# Patient Record
Sex: Male | Born: 1935 | Race: White | Hispanic: No | Marital: Married | Smoking: Never smoker
Health system: Southern US, Community
[De-identification: ages and names within clinical notes are randomized; demographics above are authoritative.]

## PROBLEM LIST (undated history)

## (undated) DIAGNOSIS — A809 Acute poliomyelitis, unspecified: Secondary | ICD-10-CM

## (undated) DIAGNOSIS — I739 Peripheral vascular disease, unspecified: Secondary | ICD-10-CM

## (undated) DIAGNOSIS — I10 Essential (primary) hypertension: Secondary | ICD-10-CM

## (undated) DIAGNOSIS — Z529 Donor of unspecified organ or tissue: Secondary | ICD-10-CM

## (undated) DIAGNOSIS — Z524 Kidney donor: Secondary | ICD-10-CM

## (undated) DIAGNOSIS — Z87448 Personal history of other diseases of urinary system: Secondary | ICD-10-CM

## (undated) DIAGNOSIS — I214 Non-ST elevation (NSTEMI) myocardial infarction: Secondary | ICD-10-CM

## (undated) DIAGNOSIS — I2510 Atherosclerotic heart disease of native coronary artery without angina pectoris: Secondary | ICD-10-CM

## (undated) HISTORY — DX: Non-ST elevation (NSTEMI) myocardial infarction: I21.4

## (undated) HISTORY — DX: Kidney donor: Z52.4

## (undated) HISTORY — DX: Donor of unspecified organ or tissue: Z52.9

## (undated) HISTORY — DX: Atherosclerotic heart disease of native coronary artery without angina pectoris: I25.10

## (undated) HISTORY — DX: Personal history of other diseases of urinary system: Z87.448

## (undated) HISTORY — DX: Acute poliomyelitis, unspecified: A80.9

## (undated) HISTORY — PX: KIDNEY SURGERY: SHX687

## (undated) HISTORY — DX: Peripheral vascular disease, unspecified: I73.9

## (undated) HISTORY — DX: Essential (primary) hypertension: I10

---

## 1980-04-08 HISTORY — PX: PANCREAS SURGERY: SHX731

## 1987-04-09 HISTORY — PX: CHOLECYSTECTOMY: SHX55

## 1999-11-07 HISTORY — PX: CORONARY ANGIOPLASTY WITH STENT PLACEMENT: SHX49

## 2001-11-06 HISTORY — PX: OTHER SURGICAL HISTORY: SHX169

## 2004-04-08 HISTORY — PX: PTCA: SHX146

## 2006-04-08 HISTORY — PX: CORONARY ANGIOPLASTY: SHX604

## 2006-06-01 ENCOUNTER — Inpatient Hospital Stay: Payer: Self-pay | Admitting: Internal Medicine

## 2006-06-01 ENCOUNTER — Other Ambulatory Visit: Payer: Self-pay

## 2006-06-03 ENCOUNTER — Encounter: Payer: Self-pay | Admitting: Cardiovascular Disease

## 2006-06-03 ENCOUNTER — Inpatient Hospital Stay (HOSPITAL_COMMUNITY): Admission: AD | Admit: 2006-06-03 | Discharge: 2006-06-05 | Payer: Self-pay | Admitting: Cardiovascular Disease

## 2006-07-04 ENCOUNTER — Emergency Department: Payer: Self-pay | Admitting: Unknown Physician Specialty

## 2008-04-20 ENCOUNTER — Ambulatory Visit: Payer: Self-pay | Admitting: Urology

## 2009-06-14 ENCOUNTER — Ambulatory Visit: Payer: Self-pay | Admitting: Vascular Surgery

## 2009-10-31 ENCOUNTER — Encounter: Payer: Self-pay | Admitting: Cardiovascular Disease

## 2009-11-08 ENCOUNTER — Ambulatory Visit: Payer: Self-pay | Admitting: Vascular Surgery

## 2009-11-29 ENCOUNTER — Ambulatory Visit: Payer: Self-pay | Admitting: General Practice

## 2009-12-05 ENCOUNTER — Encounter: Payer: Self-pay | Admitting: Cardiovascular Disease

## 2009-12-05 ENCOUNTER — Ambulatory Visit: Payer: Self-pay | Admitting: Cardiology

## 2009-12-05 ENCOUNTER — Inpatient Hospital Stay: Payer: Self-pay | Admitting: Internal Medicine

## 2009-12-06 ENCOUNTER — Encounter: Payer: Self-pay | Admitting: Cardiovascular Disease

## 2009-12-06 ENCOUNTER — Encounter: Payer: Self-pay | Admitting: Cardiology

## 2009-12-12 ENCOUNTER — Telehealth: Payer: Self-pay | Admitting: Cardiology

## 2009-12-20 ENCOUNTER — Ambulatory Visit: Payer: Self-pay | Admitting: Cardiovascular Disease

## 2009-12-20 DIAGNOSIS — I2510 Atherosclerotic heart disease of native coronary artery without angina pectoris: Secondary | ICD-10-CM | POA: Insufficient documentation

## 2009-12-20 DIAGNOSIS — I471 Supraventricular tachycardia: Secondary | ICD-10-CM

## 2009-12-20 DIAGNOSIS — E785 Hyperlipidemia, unspecified: Secondary | ICD-10-CM

## 2009-12-26 ENCOUNTER — Encounter: Payer: Self-pay | Admitting: Cardiovascular Disease

## 2010-01-03 ENCOUNTER — Ambulatory Visit: Payer: Self-pay | Admitting: Cardiovascular Disease

## 2010-01-03 ENCOUNTER — Inpatient Hospital Stay (HOSPITAL_COMMUNITY): Admission: EM | Admit: 2010-01-03 | Discharge: 2010-01-04 | Payer: Self-pay | Admitting: Emergency Medicine

## 2010-01-03 ENCOUNTER — Encounter: Payer: Self-pay | Admitting: Internal Medicine

## 2010-01-08 ENCOUNTER — Ambulatory Visit: Payer: Self-pay | Admitting: Internal Medicine

## 2010-01-08 ENCOUNTER — Ambulatory Visit (HOSPITAL_COMMUNITY): Admission: RE | Admit: 2010-01-08 | Discharge: 2010-01-09 | Payer: Self-pay | Admitting: Internal Medicine

## 2010-01-18 ENCOUNTER — Encounter: Payer: Self-pay | Admitting: Cardiovascular Disease

## 2010-01-23 ENCOUNTER — Encounter: Payer: Self-pay | Admitting: Cardiovascular Disease

## 2010-01-30 ENCOUNTER — Encounter: Payer: Self-pay | Admitting: Cardiovascular Disease

## 2010-03-09 ENCOUNTER — Ambulatory Visit: Payer: Self-pay | Admitting: Internal Medicine

## 2010-03-21 ENCOUNTER — Ambulatory Visit: Payer: Self-pay | Admitting: Cardiovascular Disease

## 2010-05-08 NOTE — Consult Note (Signed)
[facility]   Imported By: Harlon Flor 12/15/2009 16:41:41  _____________________________________________________________________  External Attachment:    Type:   Image     Comment:   External Document

## 2010-05-08 NOTE — Assessment & Plan Note (Signed)
Summary: NP6/AMD   Visit Type:  Initial Consult Primary Provider:  Dr. Lacie Scotts  CC:  F/U [facility]. "Doing well.".  History of Present Illness: 77 year old gentleman with history of coronary artery disease, PCI of the LAD and circumflex in 2008, PAD with left SFA stenting in August 2000 and who presented to the hospital on August 30 with lightheadedness, weakness, tight chest. He presents to establish care today.  He was noted to be in a junctional tachycardia with a rate of 119 beats per minute, right bundle branch block. This converted to normal sinus rhythm. He had a cardiac catheterization that showed no significant stenoses.  Cardiac catheterization showed a 70-80% OM1 disease at the distal edge of the stent. FFR was performed with a measurement of 0.9 to suggesting an insignificant lesion. It was felt to be 50-60%. No stent was placed at this time. There was 40% stenosis of the mid RCA, 30-40% ostial left main, 25% in-stent restenosis of the proximal LAD, 30% mid LAD disease, patent stent in the mid left circumflex into the OM1, 25% in-stent restenosis. 90% ostial disease of a small ramus.  He was discharged on metoprolol 50 mg daily. His wife had cut the dose in half to 25 mg IV metoprolol succinate as he was having bradycardia with rates in the 40s. On the 25 mg, he has felt well with no complaints. No further episodes of lightheadedness or dizziness.  EKG today shows normal sinus rhythm with rate 61 beats per minute, right bundle branch block, nonspecific ST changes in leads 3, aVF  Preventive Screening-Counseling & Management  Alcohol-Tobacco     Smoking Status: never  Caffeine-Diet-Exercise     Does Patient Exercise: yes  Current Medications (verified): 1)  Plavix 75 Mg Tabs (Clopidogrel Bisulfate) .... 1/2  Tablet Once Daily 2)  Glucotrol 10 Mg Tabs (Glipizide) .... 1/2 Tablet A.m. 1/2 Noon and One At Bedtime 3)  Metoprolol Succinate 50 Mg Xr24h-Tab (Metoprolol Succinate) ....  1/2 Tablet At Bedtime 4)  Simvastatin 40 Mg Tabs (Simvastatin) .... One Tablet At Bedtime 5)  Nitrostat 0.4 Mg Subl (Nitroglycerin) .... As Needed  Allergies (verified): 1)  ! * Ivp Dye  Past History:  Family History: Last updated: 12/20/2009 Family History of Coronary Artery Disease:  Family History of CVA or Stroke: Father deceased  Social History: Last updated: 12/20/2009 Married  Tobacco Use - No.  Alcohol Use - no Regular Exercise - yes-very active with yard work.  Risk Factors: Exercise: yes (12/20/2009)  Risk Factors: Smoking Status: never (12/20/2009)  Past Medical History: CAD, s/p PTCA x 2 (LAD and circumflex, last in 2008). percutaneous coronary intervention to the LAD in 2006 @ [place] PAD s/p left SFA, poplitearl, and anterior tibial as well as a stent in the left SFA on 8/03. PTCA /stent  in 11/1999 Non-ST-elevation MI Hx. of polio diabetes mellitus Hypertension Hx. of chronic kidney disease Hx. of pancreatic donation Hx. of kidney donation  Past Surgical History: CAD, s/p PTCA x 2 (LAD and circumflex, last in 2008). percutaneous coronary intervention to the LAD in 2006 @ [place] PAD s/p left SFA, poplitearl, and anterior tibial as well as a stent in the left SFA on 8/03. PTCA /stent  in 11/1999 gallbladder removed; 1989 40% pancrease removed;1982 Right kidney removed  Family History: Family History of Coronary Artery Disease:  Family History of CVA or Stroke: Father deceased  Social History: Married  Tobacco Use - No.  Alcohol Use - no Regular Exercise - yes-very active with  yard work. Smoking Status:  never Does Patient Exercise:  yes  Review of Systems  The patient denies fever, weight loss, weight gain, vision loss, decreased hearing, hoarseness, chest pain, syncope, dyspnea on exertion, peripheral edema, prolonged cough, abdominal pain, incontinence, muscle weakness, depression, and enlarged lymph nodes.    Vital Signs:  Patient  profile:   77 year old male Height:      69 inches Weight:      156 pounds BMI:     23.12 Pulse rate:   61 / minute BP sitting:   116 / 65  (left arm) Cuff size:   regular  Vitals Entered By: Bishop Dublin, CMA (December 20, 2009 11:41 AM)  Physical Exam  General:  Well developed, well nourished, in no acute distress.elderly gentleman Head:  normocephalic and atraumatic Neck:  Neck supple, no JVD. No masses, thyromegaly or abnormal cervical nodes. Lungs:  Clear bilaterally to auscultation and percussion. Heart:  Non-displaced PMI, chest non-tender; regular rate and rhythm, S1, S2 without murmurs, rubs or gallops. Carotid upstroke normal, 1+ bruit on the left. Pedals normal pulses. No edema, no varicosities. Abdomen:  abdomen soft and non-tender without masses Msk:  Back normal, normal gait. Muscle strength and tone normal. Pulses:  pulses normal in all 4 extremities Extremities:  No clubbing or cyanosis. Neurologic:  Alert and oriented x 3. Skin:  Intact without lesions or rashes. Psych:  Normal affect.   Impression & Recommendations:  Problem # 1:  CAD, NATIVE VESSEL (ICD-414.01) history of coronary artery disease with recent cardiac catheterization. Patent stents in his LAD and left circumflex. OM disease with no recent intervention. We'll continue aggressive lipid management. He is taking a half dose of Plavix as he has significant bruising. We have suggested that he add aspirin 81 mg daily, possibly to tablets if he does not have significant bruising.  The following medications were removed from the medication list:    Aspirin 81 Mg Tbec (Aspirin) ..... One tablet once daily His updated medication list for this problem includes:    Plavix 75 Mg Tabs (Clopidogrel bisulfate) .Marland Kitchen... 1/2  tablet once daily    Metoprolol Succinate 50 Mg Xr24h-tab (Metoprolol succinate) .Marland Kitchen... 1/2 tablet at bedtime    Nitrostat 0.4 Mg Subl (Nitroglycerin) .Marland Kitchen... As needed    Aspirin 81 Mg Tbec  (Aspirin) .Marland Kitchen... Take one tablet by mouth daily  Problem # 2:  SVT/ PSVT/ PAT (ICD-427.0) Notes indicate a accelerated junctional rhythm with rates in the 120s. He's not had any more of these rhythms on his metoprolol succinate 25 mg daily. We'll continue him on this dose.  The following medications were removed from the medication list:    Aspirin 81 Mg Tbec (Aspirin) ..... One tablet once daily His updated medication list for this problem includes:    Plavix 75 Mg Tabs (Clopidogrel bisulfate) .Marland Kitchen... 1/2  tablet once daily    Metoprolol Succinate 50 Mg Xr24h-tab (Metoprolol succinate) .Marland Kitchen... 1/2 tablet at bedtime    Nitrostat 0.4 Mg Subl (Nitroglycerin) .Marland Kitchen... As needed    Aspirin 81 Mg Tbec (Aspirin) .Marland Kitchen... Take one tablet by mouth daily  Problem # 3:  HYPERLIPIDEMIA-MIXED (ICD-272.4) He is currently on simvastatin 40 mg daily. We have suggested that he have his cholesterol checked in November with goal LDL less than 70. Cholesterol in June showed total cholesterol 200, LDL of 120  The following medications were removed from the medication list:    Crestor 5 Mg Tabs (Rosuvastatin calcium) ..... One tablet once  daily His updated medication list for this problem includes:    Simvastatin 40 Mg Tabs (Simvastatin) ..... One tablet at bedtime  Patient Instructions: 1)  Your physician has recommended you make the following change in your medication: START aspirin 81mg    2)  Your physician wants you to follow-up in:  3 months  You will receive a reminder letter in the mail two months in advance. If you don't receive a letter, please call our office to schedule the follow-up appointment.

## 2010-05-08 NOTE — Cardiovascular Report (Signed)
[facility]   Imported By: Harlon Flor 12/15/2009 16:41:01  _____________________________________________________________________  External Attachment:    Type:   Image     Comment:   External Document

## 2010-05-08 NOTE — Letter (Signed)
Summary: [facility]  [facility]I   Imported By: Harlon Flor 12/25/2009 08:47:22  _____________________________________________________________________  External Attachment:    Type:   Image     Comment:   External Document

## 2010-05-08 NOTE — Assessment & Plan Note (Signed)
Summary: eph/post ablation/amber   Visit Type:  Follow-up Primary [name]:  Dr. Lacie Scotts  CC:  s/p ablation.  "I am doing great"!.  History of Present Illness: [name] returns for followup. He is a pleasant 77 yo man with a h/o SVT and CAD who underwent EPS/RFA of AVNRT several months ago.  He has had no recurrent palpitations and feels well.  No c/p or sob.  Current Medications (verified): 1)  Glucotrol 10 Mg Tabs (Glipizide) .... 1/2 Noon and 1/2 At Bedtime 2)  Nitrostat 0.4 Mg Subl (Nitroglycerin) .... As Needed 3)  Aspirin 81 Mg Tbec (Aspirin) .... Take Two  Tablet By Mouth Daily  Allergies (verified): 1)  ! * Ivp Dye  Past History:  Past Medical History: Last updated: 12/20/2009 CAD, s/p PTCA x 2 (LAD and circumflex, last in 2008). percutaneous coronary intervention to the LAD in 2006 @ [place] PAD s/p left SFA, poplitearl, and anterior tibial as well as a stent in the left SFA on 8/03. PTCA /stent  in 11/1999 Non-ST-elevation MI Hx. of polio diabetes mellitus Hypertension Hx. of chronic kidney disease Hx. of pancreatic donation Hx. of kidney donation  Past Surgical History: Last updated: 12/20/2009 CAD, s/p PTCA x 2 (LAD and circumflex, last in 2008). percutaneous coronary intervention to the LAD in 2006 @ [place] PAD s/p left SFA, poplitearl, and anterior tibial as well as a stent in the left SFA on 8/03. PTCA /stent  in 11/1999 gallbladder removed; 1989 40% pancrease removed;1982 Right kidney removed  Risk Factors: Exercise: yes (12/20/2009)  Risk Factors: Smoking Status: never (12/20/2009)  Review of Systems  The patient denies chest pain, syncope, dyspnea on exertion, and peripheral edema.    Vital Signs:  Patient profile:   77 year old male Height:      69 inches Weight:      150 pounds BMI:     22.23 Pulse rate:   64 / minute BP sitting:   132 / 60  (left arm) Cuff size:   regular  Vitals Entered By: [name], CMA (March 09, 2010  10:17 AM)  Physical Exam  General:  Well developed, well nourished, in no acute distress.elderly gentleman Head:  normocephalic and atraumatic Neck:  Neck supple, no JVD. No masses, thyromegaly or abnormal cervical nodes. Lungs:  Clear bilaterally to auscultation with no wheezes, rales, or rhonchi. Heart:  Non-displaced PMI, chest non-tender; regular rate and rhythm, S1, S2 without murmurs, rubs or gallops. Carotid upstroke normal, 1+ bruit on the left. Pedals normal pulses. No edema, no varicosities. Abdomen:  abdomen soft and non-tender without masses Msk:  Back normal, normal gait. Muscle strength and tone normal. Pulses:  pulses normal in all 4 extremities Extremities:  No clubbing or cyanosis. Neurologic:  Alert and oriented x 3.   EKG  Procedure date:  03/09/2010  Findings:      Normal sinus rhythm with rate of:  Right bundle branch block.    Comments:      Evidence of remote septal MI.    Impression & Recommendations:  Problem # 1:  SVT/ PSVT/ PAT (ICD-427.0) Assessment Improved HIs symptom.s have resolved since his ablation. Will follow. The following medications were removed from the medication list:    Plavix 75 Mg Tabs (Clopidogrel bisulfate) .Marland Kitchen... 1/2  tablet once daily    Metoprolol Succinate 50 Mg Xr24h-tab (Metoprolol succinate) .Marland Kitchen... 1/2 tablet at bedtime His updated medication list for this problem includes:    Nitrostat 0.4 Mg Subl (Nitroglycerin) .Marland KitchenMarland KitchenMarland KitchenMarland Kitchen  As needed    Aspirin 81 Mg Tbec (Aspirin) .Marland Kitchen... Take two  tablet by mouth daily  Problem # 2:  CAD, NATIVE VESSEL (ICD-414.01) He remains very active and has not had any anginal symptoms.  Will continue meds as below and he will followup with Dr. Mariah Milling. The following medications were removed from the medication list:    Plavix 75 Mg Tabs (Clopidogrel bisulfate) .Marland Kitchen... 1/2  tablet once daily    Metoprolol Succinate 50 Mg Xr24h-tab (Metoprolol succinate) .Marland Kitchen... 1/2 tablet at bedtime His updated medication list  for this problem includes:    Nitrostat 0.4 Mg Subl (Nitroglycerin) .Marland Kitchen... As needed    Aspirin 81 Mg Tbec (Aspirin) .Marland Kitchen... Take two  tablet by mouth daily  Patient Instructions: 1)  Your physician recommends that you schedule a follow-up appointment in: 6 months with Dr. Mariah Milling and follow up with Dr. Ladona Ridgel as needed 2)  Your physician recommends that you continue on your current medications as directed. Please refer to the Current Medication list given to you today.

## 2010-05-08 NOTE — Progress Notes (Signed)
Summary: DIZZINESS  Phone Note Call from Patient Call back at Home Phone [phone]   Caller: [name]) Call For: [facility] Summary of Call: PT HAD CATH LAST WEEK AND HE IS FEELING DIZZY-HAS BEEN TAKING METOPROLOL-HAS NOT TAKEN THE MEDICATION THIS MORNING BECAUSE OF THE DIZZINESS-BP IS 135/68 THIS MORNING-BP 111/52 NOW Initial call taken by: Harlon Flor,  December 12, 2009 10:25 AM  Follow-up for Phone Call        124/62-65 pt feeling much better, only took 1/2 metoprolol this am. Please advise if it is okay to continue taking 1/2 metoprolol two times a day? Follow-up by: Benedict Needy, RN,  December 12, 2009 2:55 PM     Appended Document: DIZZINESS ok to take 12.5 mg bid  Appended Document: DIZZINESS LMOM TCB   Appended Document: DIZZINESS no answer after 10+ rings.   Appended Document: DIZZINESS LMOM TCB  Appended Document: DIZZINESS pt is aware

## 2010-05-08 NOTE — Cardiovascular Report (Signed)
Summary: Cardiac Cath Other  Cardiac Cath Other   Imported By: Harlon Flor 12/25/2009 08:39:40  _____________________________________________________________________  External Attachment:    Type:   Image     Comment:   External Document

## 2010-05-08 NOTE — Letter (Signed)
Summary: [facility] No Show Fax   [facility] No Show Fax   Imported By: Roderic Ovens 02/13/2010 12:45:53  _____________________________________________________________________  External Attachment:    Type:   Image     Comment:   External Document

## 2010-05-08 NOTE — Miscellaneous (Signed)
Summary: [facility]   [facility]   Imported By: Roderic Ovens 02/07/2010 12:27:48  _____________________________________________________________________  External Attachment:    Type:   Image     Comment:   External Document

## 2010-05-08 NOTE — Procedures (Signed)
Summary: Summary Report  Summary Report   Imported By: Erle Crocker 02/23/2010 16:01:49  _____________________________________________________________________  External Attachment:    Type:   Image     Comment:   External Document

## 2010-05-08 NOTE — Cardiovascular Report (Signed)
Summary: Cardiac Cath Other  Cardiac Cath Other   Imported By: Harlon Flor 12/25/2009 08:39:55  _____________________________________________________________________  External Attachment:    Type:   Image     Comment:   External Document

## 2010-05-10 NOTE — Procedures (Signed)
Summary: [facility]  [facility]   Imported By: Harlon Flor 04/12/2010 09:54:42  _____________________________________________________________________  External Attachment:    Type:   Image     Comment:   External Document

## 2010-06-21 LAB — GLUCOSE, CAPILLARY
Glucose-Capillary: 112 mg/dL — ABNORMAL HIGH (ref 70–99)
Glucose-Capillary: 143 mg/dL — ABNORMAL HIGH (ref 70–99)
Glucose-Capillary: 148 mg/dL — ABNORMAL HIGH (ref 70–99)
Glucose-Capillary: 155 mg/dL — ABNORMAL HIGH (ref 70–99)
Glucose-Capillary: 180 mg/dL — ABNORMAL HIGH (ref 70–99)
Glucose-Capillary: 72 mg/dL (ref 70–99)

## 2010-06-21 LAB — BASIC METABOLIC PANEL
BUN: 27 mg/dL — ABNORMAL HIGH (ref 6–23)
CO2: 28 mEq/L (ref 19–32)
Chloride: 105 mEq/L (ref 96–112)
Creatinine, Ser: 1.44 mg/dL (ref 0.4–1.5)
Glucose, Bld: 118 mg/dL — ABNORMAL HIGH (ref 70–99)
Potassium: 4.5 mEq/L (ref 3.5–5.1)

## 2010-06-21 LAB — CBC
MCH: 30.9 pg (ref 26.0–34.0)
MCHC: 33.8 g/dL (ref 30.0–36.0)
MCHC: 34 g/dL (ref 30.0–36.0)
Platelets: 131 10*3/uL — ABNORMAL LOW (ref 150–400)
Platelets: 132 10*3/uL — ABNORMAL LOW (ref 150–400)
RBC: 3.72 MIL/uL — ABNORMAL LOW (ref 4.22–5.81)
RDW: 13 % (ref 11.5–15.5)
RDW: 13 % (ref 11.5–15.5)

## 2010-06-21 LAB — COMPREHENSIVE METABOLIC PANEL
ALT: 20 U/L (ref 0–53)
AST: 27 U/L (ref 0–37)
Calcium: 9.6 mg/dL (ref 8.4–10.5)
Creatinine, Ser: 1.61 mg/dL — ABNORMAL HIGH (ref 0.4–1.5)
GFR calc Af Amer: 50 mL/min — ABNORMAL LOW (ref >60)
Sodium: 139 mEq/L (ref 135–145)
Total Protein: 7 g/dL (ref 6.0–8.3)

## 2010-06-21 LAB — APTT: aPTT: 30 seconds (ref 24–37)

## 2010-06-21 LAB — CK TOTAL AND CKMB (NOT AT ARMC): Relative Index: INVALID (ref 0.0–2.5)

## 2010-06-21 LAB — PROTIME-INR
Prothrombin Time: 13.2 seconds (ref 11.6–15.2)
Prothrombin Time: 14.3 seconds (ref 11.6–15.2)

## 2010-06-21 LAB — DIFFERENTIAL
Eosinophils Absolute: 0.3 10*3/uL (ref 0.0–0.7)
Eosinophils Relative: 5 % (ref 0–5)
Lymphocytes Relative: 18 % (ref 12–46)
Lymphs Abs: 1 10*3/uL (ref 0.7–4.0)
Monocytes Relative: 7 % (ref 3–12)
Neutrophils Relative %: 70 % (ref 43–77)

## 2010-06-21 LAB — CARDIAC PANEL(CRET KIN+CKTOT+MB+TROPI)
CK, MB: 5.2 ng/mL — ABNORMAL HIGH (ref 0.3–4.0)
CK, MB: 5.6 ng/mL — ABNORMAL HIGH (ref 0.3–4.0)
Relative Index: INVALID (ref 0.0–2.5)
Troponin I: 0.5 ng/mL (ref 0.00–0.06)
Troponin I: 0.71 ng/mL (ref 0.00–0.06)

## 2010-06-21 LAB — TROPONIN I: Troponin I: 0.11 ng/mL — ABNORMAL HIGH (ref 0.00–0.06)

## 2010-08-24 NOTE — Discharge Summary (Signed)
[name], RYNER               ACCOUNT NO.:  [id number]   MEDICAL RECORD NO.:  [id number]          PATIENT TYPE:  INP   LOCATION:  6524                         FACILITY:  [facility]   PHYSICIAN:  Abelino Derrick, P.A.   DATE OF BIRTH:  [birth year]   DATE OF ADMISSION:  06/03/2006  DATE OF DISCHARGE:  06/05/2006                               DISCHARGE SUMMARY   DISCHARGE DIAGNOSES:  1. Unstable angina with abnormal Cardiolite study, circumflex stenting      this admission.  2. Known coronary disease with prior left anterior descending stenting      in [place] in 2006.  3. Treated dyslipidemia.  4. Non-insulin-dependent diabetes.  5. History of polio.  6. Chronic renal insufficiency, creatinine is 1.6 at discharge.  7. Treated hypertension.   HOSPITAL COURSE:  The patient is a 77 year old male who presented at  [facility] 06/01/2006 with acute chest pain,  diaphoresis and nausea.  He has been actually having symptoms for a  couple of weeks but they were worse on the day of admission.  He does  have a history of coronary disease and then had an left anterior  descending stent in [place] in 2006.  Ech[facility]ogram as [facility] shows ejection fraction 50 to 55%.  Cardiolite stress test was  also performed which revealed anterolateral ischemia.  He was  transferred to [facility] for further evaluation and catheterization.  His  creatinine was 1.4.  The patient had a catheterization done 06/03/2006  by Dr. Allyson Sabal.  This revealed his EF to be 55%.  Catheterization revealed  a tapered 40% proximal left main, patent LAD stent, 40% mid LAD, 80%  circumflex at the takeoff of large second OM branch and a 50 to 60% mid  RCA.  The patient underwent circumflex intervention with a Taxus stent.  Post procedure he did well.  We did use Mucomyst and IV fluids for renal  preservation.  We held his ARB.  Postprocedure his creatinine went up to  1.6.  He did have some residual  chest pain and there was some concern  that this may be coming from the RCA as there was also some evidence of  inferior ischemia on Cardiolite.  Nitrate was added as well as PPI.  He  was kept an extra 24 hours.  He is pain free on the 28th and Dr. Tresa Endo  feels he can be discharged.  Will hold his ACE inhibitors and ARB for  now.  He will follow up with Dr. Tresa Endo in a couple weeks in [place].   DISCHARGE MEDICATIONS:  Coated aspirin 325 mg once a day, Plavix 75 mg a  day, glipizide 5 mg a day, simvastatin 40 mg a day, Imdur 30 mg a day,  metoprolol 25 mg twice a day, omeprazole 20 mg a day and nitroglycerin  sublingual p.r.n.   LABS:  Troponin went to 0.15.  CK and MBs were negative.  At discharge  his sodium was 137, potassium 4.6, BUN 35, creatinine 1.6.  White count  11.3, hemoglobin 13.2, hematocrit 38.7, platelets 127.  Her EKG  shows  sinus rhythm, right bundle branch block, sinus bradycardia.   DISPOSITION:  The patient discharged in stable condition.  He will need  a follow-up CBC and platelet count and BMP in 1 week in [place].  He  will see Dr. Tresa Endo also in follow up.      Abelino Derrick, P.Memory Dance  D:  06/05/2006  T:  06/05/2006  Job:  [id number]

## 2010-08-24 NOTE — Cardiovascular Report (Signed)
[name], [name]               ACCOUNT NO.:  [id number]   MEDICAL RECORD NO.:  [id number]          PATIENT TYPE:  INP   LOCATION:  6524                         FACILITY:  [facility]   PHYSICIAN:  Nanetta Batty, M.D.   DATE OF BIRTH:  [birth year]   DATE OF PROCEDURE:  06/03/2006  DATE OF DISCHARGE:                            CARDIAC CATHETERIZATION   PROCEDURE:  Cardiac catheterization/PCI stent.   INDICATIONS:  Mr. [name] is a 77 year old gentleman who underwent  stenting approximately 2 years ago to his LAD.  His problems included  history of hyperlipidemia, diabetes, and renal insufficiency from  donation of kidney to his daughter.  He presented to [facility] over this past weekend with unstable angina.  His enzymes were  negative.  He underwent Myoview stress testing, which was remarkable for  anterolateral ischemia.  He did develop ST segment depression during  exercise with chest pain.  He was transferred to [facility] for cardiac  catheterization and potential intervention because of the high risk  nature of his stress test.   PROCEDURE IN DETAIL:  The patient was brought to the second floor [facility] cardiac catheterization lab in a postabsorptive state.  He was  premedicated with p.o. valium, IV Benadryl, Solu-Medrol, and Pepcid for  presumed contrast allergy prophylaxis.  His right groin was prepped and  draped in the usual sterile fashion.  A 1% Xylocaine was used for local  anesthesia.  A 6 French sheath was inserted into the right femoral  artery using standard Seldinger technique.  A 6 French right Judkins  catheter was started as well as a 6 French pigtail catheter, was used  for selective coronary angiography and left ventricular artery  respectively.  This pigtail was used through the entirety of the case.  Retrograde aortogram, ventricular blood pressures were recorded.   HEMODYNAMICS:  Aortic systolic pressure 136, diastolic pressure 56, left  ventricular systolic pressure 136 and diastolic pressure 11.   Selective coronary angiography.  1. Left main:  Left main left main had an approximately 40% osteal      taper stenosis without __________.  2. LAD:  There was a stent in the proximal LAD, which was widely      patent.  There was a very small focal area of 30% narrowing, which      was hypodense and just after the stent appears a 40% segmental      stenosis in the mid to distal portion.  3. Left circumflex; this is a nondominant vessel to the large marginal      branch and then 80% stenosis in the mid portion.  4. Right coronary artery; a large dominant vessel with 50% to 60%      stenosis in the mid portion.  5. Left ventriculography; RAO left ventriculogram was performed using      25 mL of Visipaque dye at 12 mL per second.  The overall LV EF was      estimated approximately 55% without focal wall motion      abnormalities.   IMPRESSION:  Mr. [name] has high grade mid  circumflex disease.  Because  of the minimal amount of dye used for the diagnostic case, I feel  compelled to proceed with intervention at this time.   The patient was on aspirin and Plavix already, received an initial 300  mg of Plavix.  He received Angiomax bolus with an ACT of greater than  250.  Using an __________ 3-0 guide catheter along with an 0.014 190  Asahi soft wire, a 259 Maverick, a PTC was performed.  200 mcg of  coronary nitroglycerin was administered.  This revealed a larger distal  vessel than originally visualized.  Following this, a 25 12 Taxus drug-  eluting stent was then deployed at 14 atmosphere (2.8 mm), resulting in  reduction of 80% to 0% residual without dissection.  There was TIMI 3  flow at the end of the case in all vessels.  The patient tolerated the  procedure well without hemodynamic electrocardiograph sequelae.   IMPRESSION:  Mr. [name] is status post left circumflex PCI stenting  using Angiomax and drug-eluting stent  (Taxus).  We will proceed  hydration and check his renal function in the morning.  He will continue  with aspirin and Plavix.  He will be discharged home when clinically  appropriate, likely in the next 24-48 hours.  He left the operating room  in stable condition.      Nanetta Batty, M.D.  Electronically Signed     JB/MEDQ  D:  06/03/2006  T:  06/04/2006  Job:  [id number]   cc:   [facility]  Second Floor [facility] Cardiac Cath lab  [facility]

## 2010-10-17 ENCOUNTER — Encounter: Payer: Self-pay | Admitting: Internal Medicine

## 2010-10-29 ENCOUNTER — Inpatient Hospital Stay: Payer: Self-pay | Admitting: Internal Medicine

## 2010-10-29 DIAGNOSIS — R079 Chest pain, unspecified: Secondary | ICD-10-CM

## 2010-10-30 DIAGNOSIS — I214 Non-ST elevation (NSTEMI) myocardial infarction: Secondary | ICD-10-CM

## 2010-11-02 ENCOUNTER — Other Ambulatory Visit: Payer: Self-pay | Admitting: Cardiovascular Disease

## 2010-11-02 ENCOUNTER — Other Ambulatory Visit: Payer: Self-pay

## 2010-11-02 MED ORDER — CLOPIDOGREL BISULFATE 75 MG PO TABS: 75.0000 mg | ORAL_TABLET | Freq: Every day | ORAL | Status: DC

## 2010-11-02 NOTE — Telephone Encounter (Signed)
Please send in the Rx for the Generic version of Plavix 75 mg.

## 2010-11-06 ENCOUNTER — Encounter: Payer: Self-pay | Admitting: Cardiovascular Disease

## 2010-11-07 ENCOUNTER — Telehealth: Payer: Self-pay

## 2010-11-07 MED ORDER — CLOPIDOGREL BISULFATE 75 MG PO TABS: 75.0000 mg | ORAL_TABLET | Freq: Every day | ORAL | Status: DC

## 2010-11-07 NOTE — Telephone Encounter (Signed)
Needs a refill on plavix.  [facility].

## 2010-11-07 NOTE — Telephone Encounter (Signed)
The patient is to call [facility] with some updated information.

## 2010-11-14 ENCOUNTER — Ambulatory Visit (INDEPENDENT_AMBULATORY_CARE_PROVIDER_SITE_OTHER): Payer: Medicare Other | Admitting: Cardiovascular Disease

## 2010-11-14 ENCOUNTER — Encounter: Payer: Self-pay | Admitting: Cardiovascular Disease

## 2010-11-14 DIAGNOSIS — I2510 Atherosclerotic heart disease of native coronary artery without angina pectoris: Secondary | ICD-10-CM

## 2010-11-14 DIAGNOSIS — I739 Peripheral vascular disease, unspecified: Secondary | ICD-10-CM

## 2010-11-14 DIAGNOSIS — I471 Supraventricular tachycardia, unspecified: Secondary | ICD-10-CM

## 2010-11-14 DIAGNOSIS — E119 Type 2 diabetes mellitus without complications: Secondary | ICD-10-CM | POA: Insufficient documentation

## 2010-11-14 DIAGNOSIS — E785 Hyperlipidemia, unspecified: Secondary | ICD-10-CM

## 2010-11-14 NOTE — Assessment & Plan Note (Signed)
Notes from the hospital indicate peripheral vascular disease. Previous angiography of the left lower extremity in August 2011. Would recommend cholesterol medication and good diabetes control.

## 2010-11-14 NOTE — Assessment & Plan Note (Signed)
We have encouraged continued exercise, careful diet management in an effort to Maintain good sugar control.

## 2010-11-14 NOTE — Assessment & Plan Note (Signed)
As mentioned, we will recommend Crestor 5 mg daily recheck his cholesterol in several months time. We have given him samples. He will call us if these have adequate side effects and is able to tolerate the medication then we will call in a prescription.

## 2010-11-14 NOTE — Patient Instructions (Signed)
You are doing well. Please start crestor 5 mg daily Please call us if you have new issues that need to be addressed before your next appt.  We will call you for a follow up Appt. In 6 months

## 2010-11-14 NOTE — Progress Notes (Signed)
Patient ID: Saeed Toren, male    DOB: [birth year], 76 y.o.   MRN: [id number]  HPI Comments: Mr. 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 He was noted to have elevated cardiac enzymes with CK-MB of 12.7, troponin 0.24 on arrival.  His enzymes peaked and trended down. Given his underlying renal disease, cardiac catheterization was not performed. Peak creatinine was above his baseline at 1.67 while in the hospital despite no contrast dye. He has not had any further chest pain since discharge. Prior to discharge, he had a Myoview, pharmacological that showed no ischemia. He was discharged on medical management.  He presents today and reports that he has stopped all of his medications except for aspirin and his diabetes pill. When asked why he stopped his medications including statin, Plavix, low-dose beta blocker, he said that it made him dizzy though his symptoms of dizziness have not improved without the medications. His dizziness is chronic. He reports the statin makes his muscles sore though his myalgias have persisted despite the statin discontinuation. He does have polio and attributes the discomfort in his legs to the polio. In general, he does not like taking medications.  EKG shows normal sinus rhythm with rate 84 beats per minute with right bundle branch block, possible old septal infarct, nonspecific ST abnormality   Outpatient Encounter Prescriptions as of 11/14/2010  Medication Sig Dispense Refill  . aspirin 81 MG tablet  Take 81 mg by mouth daily.       Marland Kitchen glipiZIDE (GLUCOTROL XL) 2.5 MG 24 hr tablet Take 2.5 mg by mouth daily.        . nitroGLYCERIN (NITROSTAT) 0.4 MG SL tablet Place 0.4 mg under the tongue as needed.          Review of Systems  Constitutional: Negative.   HENT: Negative.   Eyes: Negative.   Respiratory: Negative.   Cardiovascular: Negative.   Gastrointestinal: Negative.   Musculoskeletal: Negative.   Skin: Negative.   Neurological: Negative.   Hematological: Negative.   Psychiatric/Behavioral: Negative.   All other systems reviewed and are negative.   BP 124/67  Pulse 84  Ht 5\' 9"  (1.753 m)  Wt 145 lb (65.772 kg)  BMI 21.41 kg/m2  Physical Exam  Nursing note and vitals reviewed. Constitutional: He is oriented to person, place, and time. He appears well-developed and well-nourished.  HENT:  Head: Normocephalic.  Nose: Nose normal.  Mouth/Throat: Oropharynx is clear and moist.  Eyes: Conjunctivae are normal. Pupils are equal, round, and reactive to light.  Neck: Normal range of motion. Neck supple. No JVD present.  Cardiovascular: Normal rate, regular rhythm, S1 normal, S2 normal, normal heart sounds and intact distal pulses.  Exam reveals no gallop and no friction rub.   No murmur heard. Pulmonary/Chest: Effort normal and breath sounds normal. No respiratory distress. He has no wheezes. He has no rales. He exhibits no tenderness.  Abdominal: Soft. Bowel sounds are normal. He exhibits no distension. There is no tenderness.  Musculoskeletal: Normal range of motion. He exhibits no edema and no tenderness.  Lymphadenopathy:    He has no cervical adenopathy.  Neurological: He is alert and oriented to person, place, and time. Coordination normal.  Skin: Skin is warm and dry. No rash noted. No erythema.  Psychiatric: He has a normal mood and affect. His behavior is normal. Judgment and thought content normal.           Assessment and Plan

## 2010-11-14 NOTE — Assessment & Plan Note (Signed)
Severe underlying coronary artery disease. Recent non-STEMI with negative stress test showing no ischemia. He is medication noncompliance. We have encouraged him to start Crestor 5 mg daily in addition to his low-dose aspirin. This took a long period of discussion and encouragement. Uncertain if he will stay on a statin. Ideally he should be on a low dose beta blocker and this was discussed with him. He is not interested.

## 2011-07-17 ENCOUNTER — Ambulatory Visit (INDEPENDENT_AMBULATORY_CARE_PROVIDER_SITE_OTHER): Payer: Medicare Other | Admitting: Cardiovascular Disease

## 2011-07-17 ENCOUNTER — Encounter: Payer: Self-pay | Admitting: Cardiovascular Disease

## 2011-07-17 VITALS — BP 118/58 | HR 74 | Ht 69.0 in | Wt 153.8 lb

## 2011-07-17 DIAGNOSIS — I2510 Atherosclerotic heart disease of native coronary artery without angina pectoris: Secondary | ICD-10-CM

## 2011-07-17 DIAGNOSIS — E119 Type 2 diabetes mellitus without complications: Secondary | ICD-10-CM

## 2011-07-17 DIAGNOSIS — I739 Peripheral vascular disease, unspecified: Secondary | ICD-10-CM

## 2011-07-17 DIAGNOSIS — E785 Hyperlipidemia, unspecified: Secondary | ICD-10-CM

## 2011-07-17 DIAGNOSIS — R0989 Other specified symptoms and signs involving the circulatory and respiratory systems: Secondary | ICD-10-CM

## 2011-07-17 NOTE — Assessment & Plan Note (Signed)
He did not increase his diabetes medication dose as recommended by Dr. Lacie Scotts. He reports that he cuts the medication in half.

## 2011-07-17 NOTE — Assessment & Plan Note (Signed)
We'll try to obtain his most recent cholesterol panel from Dr. Lacie Scotts. He does not want a cholesterol medication.

## 2011-07-17 NOTE — Patient Instructions (Addendum)
You are doing well. No medication changes were made.  We will obtain your labs from Dr. Glenis Smoker We will schedule a carotid ultrasound for Bruit  Please call us if you have new issues that need to be addressed before your next appt.  Your physician wants you to follow-up in: 6 months.  You will receive a reminder letter in the mail two months in advance. If you don't receive a letter, please call our office to schedule the follow-up appointment.

## 2011-07-17 NOTE — Assessment & Plan Note (Signed)
Carotid bruit on clinical exam. We will order repeat carotid ultrasound. He has followup with Dr. Wyn Quaker for his lower extremity arterial disease. Repeat stenting was unsuccessful as stent is occluded per the patient.

## 2011-07-17 NOTE — Progress Notes (Signed)
Patient ID: [name], male    DOB: 26-Aug-1924, 78 y.o.   MRN: [id number]  HPI Comments: Mr. 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 He was noted to have elevated cardiac enzymes with CK-MB of 12.7, troponin 0.24 on arrival.  His enzymes peaked and trended down. Given his underlying renal disease, cardiac catheterization was not performed. Peak creatinine was above his baseline at 1.67 while in the hospital despite no contrast dye. He has not had any further chest pain since discharge. Prior to discharge, he had a Myoview, pharmacological that showed no ischemia. He was discharged on medical management.  On his last clinic visit, he had stopped almost all of his medications. Previously, he reported that they've made him dizzy. Notes indicate some chronic dizziness. Today he reports he is taking glipizide 5 mg daily. He does not take aspirin because " he bleeds easily ". He denies any significant shortness of breath or chest pain . He walks with a cane secondary to leg weakness . In the past he reports not like taking medications . He would only take a cholesterol medication if it was "very very high ".   EKG shows normal sinus rhythm with rate 74 beats per minute with right bundle branch block, possible old septal infarct, nonspecific ST abnormality leads V3 through V5, 2, 3, aVF   Outpatient Encounter Prescriptions as of 07/17/2011  Medication Sig Dispense Refill  . nitroGLYCERIN (NITROSTAT) 0.4 MG SL  tablet Place 0.4 mg under the tongue as needed.        Marland Kitchen glipiZIDE (GLUCOTROL XL) 2.5 MG 24 hr tablet Take 2.5 mg by mouth daily.        Marland Kitchen DISCONTD: aspirin 81 MG tablet Take 81 mg by mouth daily.          Review of Systems  Constitutional: Negative.   HENT: Negative.   Eyes: Negative.   Respiratory: Negative.   Cardiovascular: Negative.   Gastrointestinal: Negative.   Musculoskeletal: Positive for gait problem.       Leg weakness  Skin: Negative.   Neurological: Negative.   Hematological: Negative.   Psychiatric/Behavioral: Negative.   All other systems reviewed and are negative.   BP 118/58  Pulse 74  Ht 5\' 9"  (1.753 m)  Wt 153 lb 12.8 oz (69.763 kg)  BMI 22.71 kg/m2  Physical Exam  Nursing note and vitals reviewed. Constitutional: He is oriented to person, place, and time. He appears well-developed and well-nourished.  HENT:  Head: Normocephalic.  Nose: Nose normal.  Mouth/Throat: Oropharynx is clear and moist.  Eyes: Conjunctivae are normal. Pupils are equal, round, and reactive to light.  Neck: Normal range of motion. Neck supple. No JVD present. Carotid bruit is present.  Cardiovascular: Normal rate, regular rhythm, S1 normal, S2 normal, normal heart sounds and intact distal pulses.  Exam reveals no gallop and no friction rub.   No murmur heard. Pulmonary/Chest: Effort normal and breath sounds normal. No respiratory distress. He has no wheezes. He has no rales. He exhibits no tenderness.  Abdominal: Soft.  Bowel sounds are normal. He exhibits no distension. There is no tenderness.  Musculoskeletal: He exhibits no edema and no tenderness.       Very thin legs with weakness on the right, leg brace of the right lower extremity  Lymphadenopathy:    He has no cervical adenopathy.  Neurological: He is alert and oriented to person, place, and time. Coordination normal.  Skin: Skin is warm and dry. No rash noted. No erythema.  Psychiatric: He has a normal mood and affect.  His behavior is normal. Judgment and thought content normal.           Assessment and Plan

## 2011-07-17 NOTE — Assessment & Plan Note (Signed)
He denies any symptoms of angina. No further testing ordered. He is medication noncompliant and does not want aspirin or statin.

## 2011-07-25 ENCOUNTER — Encounter (INDEPENDENT_AMBULATORY_CARE_PROVIDER_SITE_OTHER): Payer: Medicare Other

## 2011-07-25 DIAGNOSIS — R0989 Other specified symptoms and signs involving the circulatory and respiratory systems: Secondary | ICD-10-CM

## 2011-07-25 DIAGNOSIS — R42 Dizziness and giddiness: Secondary | ICD-10-CM

## 2011-07-25 DIAGNOSIS — I6529 Occlusion and stenosis of unspecified carotid artery: Secondary | ICD-10-CM

## 2011-08-12 ENCOUNTER — Telehealth: Payer: Self-pay | Admitting: Cardiovascular Disease

## 2011-08-12 NOTE — Telephone Encounter (Signed)
Patient called was told of carotid doppler results.Advised needs repeating again in 6 months.Spoke to [place] in [place] office will put in recalls for 6 months.Patient also told to work on [employer] total cholesterol < 150.

## 2011-08-12 NOTE — Telephone Encounter (Signed)
Fu msg Pt returning your call 

## 2012-02-07 ENCOUNTER — Other Ambulatory Visit: Payer: Self-pay | Admitting: Cardiology

## 2012-02-07 DIAGNOSIS — I6529 Occlusion and stenosis of unspecified carotid artery: Secondary | ICD-10-CM

## 2012-02-13 ENCOUNTER — Encounter (INDEPENDENT_AMBULATORY_CARE_PROVIDER_SITE_OTHER): Payer: Medicare Other

## 2012-02-13 DIAGNOSIS — R42 Dizziness and giddiness: Secondary | ICD-10-CM

## 2012-02-13 DIAGNOSIS — I6529 Occlusion and stenosis of unspecified carotid artery: Secondary | ICD-10-CM

## 2012-03-13 ENCOUNTER — Ambulatory Visit (INDEPENDENT_AMBULATORY_CARE_PROVIDER_SITE_OTHER): Payer: Medicare Other | Admitting: Cardiovascular Disease

## 2012-03-13 ENCOUNTER — Encounter: Payer: Self-pay | Admitting: Cardiovascular Disease

## 2012-03-13 VITALS — BP 100/50 | HR 68 | Ht 69.0 in | Wt 154.5 lb

## 2012-03-13 DIAGNOSIS — I2510 Atherosclerotic heart disease of native coronary artery without angina pectoris: Secondary | ICD-10-CM

## 2012-03-13 DIAGNOSIS — R0789 Other chest pain: Secondary | ICD-10-CM

## 2012-03-13 DIAGNOSIS — M79609 Pain in unspecified limb: Secondary | ICD-10-CM

## 2012-03-13 DIAGNOSIS — M79602 Pain in left arm: Secondary | ICD-10-CM

## 2012-03-13 DIAGNOSIS — I739 Peripheral vascular disease, unspecified: Secondary | ICD-10-CM

## 2012-03-13 DIAGNOSIS — E119 Type 2 diabetes mellitus without complications: Secondary | ICD-10-CM

## 2012-03-13 DIAGNOSIS — E785 Hyperlipidemia, unspecified: Secondary | ICD-10-CM

## 2012-03-13 MED ORDER — NITROGLYCERIN 0.4 MG SL SUBL: 0.4000 mg | SUBLINGUAL_TABLET | SUBLINGUAL | Status: AC | PRN

## 2012-03-13 NOTE — Patient Instructions (Addendum)
You are doing well. No medication changes were made.  Please call us if you have new issues that need to be addressed before your next appt.  Your physician wants you to follow-up in: 6 months.  You will receive a reminder letter in the mail two months in advance. If you don't receive a letter, please call our office to schedule the follow-up appointment.   

## 2012-03-13 NOTE — Assessment & Plan Note (Signed)
60-70% left internal carotid arterial disease. We'll repeat next year.

## 2012-03-13 NOTE — Assessment & Plan Note (Signed)
He is not interested in being on a cholesterol medication.

## 2012-03-13 NOTE — Assessment & Plan Note (Signed)
Currently with no symptoms of angina. No further workup at this time. Continue current medication regimen. 

## 2012-03-13 NOTE — Progress Notes (Signed)
Patient ID: [name], male    DOB: 02/07/1925, 78 y.o.   MRN: [id number]  HPI Comments: [name] is a 78 year old gentleman with history of medication noncompliance, chronic dizziness, coronary artery disease, previous multiple stent placement with initial stent in 2006 in [place], 2008 at [facility], catheter in August 2011 showing diffuse coronary artery disease, history of diabetes, 60-70% left internal carotid arterial disease, sleep apnea (noncompliant with CPAP),  hypertension, peripheral vascular disease, one kidney with chronic underlying kidney disease, history of polio with weakness on the left who presented to the hospital at [facility] on October 29 2010 with chest pain after he had a conversation with the [employer] on the phone. He was noted to have elevated cardiac enzymes with CK-MB of 12.7, troponin 0.24 on arrival.  His enzymes peaked and trended down. Given his underlying renal disease, cardiac catheterization was not performed. Peak creatinine was above his baseline at 1.67 while in the hospital despite no contrast dye. He has not had any further chest pain since discharge. Prior to discharge, he had a Myoview, pharmacological that showed no ischemia. He was discharged on medical management.  Several visits ago, he  stopped almost all of his medications.  he reported that they made him dizzy. Notes indicate some chronic dizziness and he continues to have symptoms. Now he attributes his symptoms to taking glipizide 5 mg daily. He does not take aspirin because " he bleeds easily ". He denies any significant shortness of breath or chest pain . He walks with a cane secondary to leg weakness . In the past  He would only take a cholesterol medication if it was "very very high ".   EKG shows normal sinus rhythm with rate 68 beats per minute with right bundle branch block, possible old septal infarct, nonspecific ST abnormality leads V3 through V5, 2, 3, aVF   Outpatient Encounter Prescriptions as  of 03/13/2012  Medication Sig Dispense Refill  . glipiZIDE (GLUCOTROL) 10 MG tablet Takes 1/2 tablet 3 times daily.      . nitroGLYCERIN (NITROSTAT) 0.4 MG SL tablet Place 1 tablet (0.4 mg total) under the tongue as needed.  25 tablet  6  . [DISCONTINUED] nitroGLYCERIN (NITROSTAT) 0.4 MG SL tablet Place 0.4 mg under the tongue as needed.        . [DISCONTINUED] glipiZIDE (GLUCOTROL XL) 2.5 MG 24 hr tablet Take 2.5 mg by mouth daily.           Review of Systems  Constitutional: Negative.   HENT: Negative.   Eyes: Negative.   Respiratory: Negative.   Cardiovascular: Negative.   Gastrointestinal: Negative.   Musculoskeletal: Positive for gait problem.       Leg weakness  Skin: Negative.   Neurological: Positive for dizziness.  Hematological: Negative.   Psychiatric/Behavioral: Negative.   All other systems reviewed and are negative.   BP 100/50  Pulse 68  Ht 5\' 9"  (1.753 m)  Wt 154 lb 8 oz (70.081 kg)  BMI 22.82 kg/m2 Recheck of the blood pressure showed systolic pressure 120  Physical Exam  Nursing note and vitals reviewed. Constitutional: He is oriented to person, place, and time. He appears well-developed and well-nourished.  HENT:  Head: Normocephalic.  Nose: Nose normal.  Mouth/Throat: Oropharynx is clear and moist.  Eyes: Conjunctivae normal are normal. Pupils are equal, round, and reactive to light.  Neck: Normal range of motion. Neck supple. No JVD present. Carotid bruit is present.  Cardiovascular: Normal rate, regular rhythm, S1  normal, S2 normal, normal heart sounds and intact distal pulses.  Exam reveals no gallop and no friction rub.   No murmur heard. Pulmonary/Chest: Effort normal and breath sounds normal. No respiratory distress. He has no wheezes. He has no rales. He exhibits no tenderness.  Abdominal: Soft. Bowel sounds are normal. He exhibits no distension. There is no tenderness.  Musculoskeletal: He exhibits no edema and no tenderness.       Very thin  legs with weakness on the right, leg brace of the right lower extremity  Lymphadenopathy:    He has no cervical adenopathy.  Neurological: He is alert and oriented to person, place, and time. Coordination normal.  Skin: Skin is warm and dry. No rash noted. No erythema.  Psychiatric: He has a normal mood and affect. His behavior is normal. Judgment and thought content normal.           Assessment and Plan

## 2012-03-13 NOTE — Assessment & Plan Note (Addendum)
He attributes his dizziness to glipizide. His wife has been reading the side effect profile and reports that he has symptoms similar to possible side effects. We have suggested that he discuss this with Dr. Sampson Goon before stopping the medication.

## 2012-03-19 IMAGING — XA IR VASCULAR PROCEDURE
15 of 24 series · 15 of 24 positions shown · non-contrast
Comparison: none

[Series 1: aorta · 1 of 2 slices shown (1 of 4)]
[im 1/2]
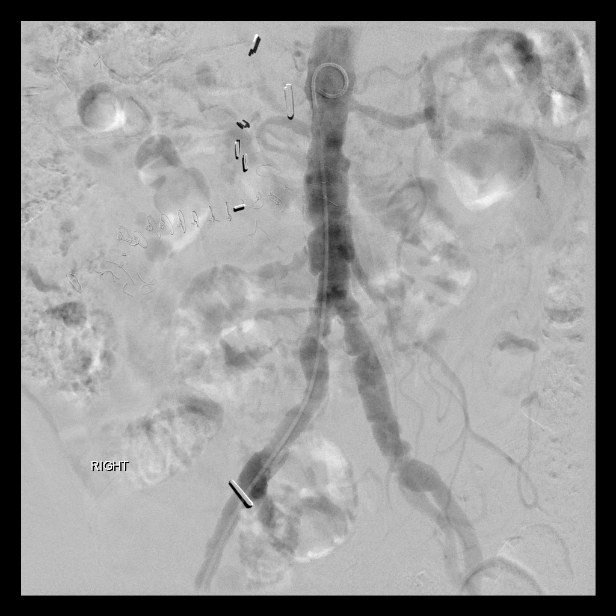

[Series 3: aorta · 1 of 2 slices shown (2 of 4)]
[im 1/2]
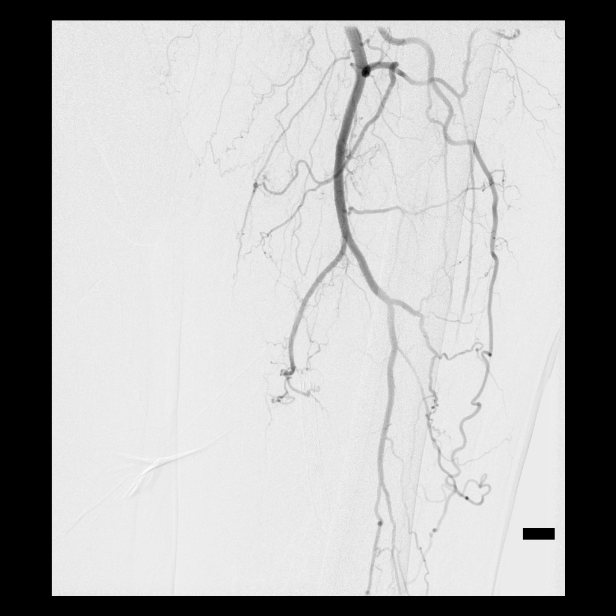

[Series 5: aorta · 1 of 1 slices shown (3 of 4)]
[im 1/1]
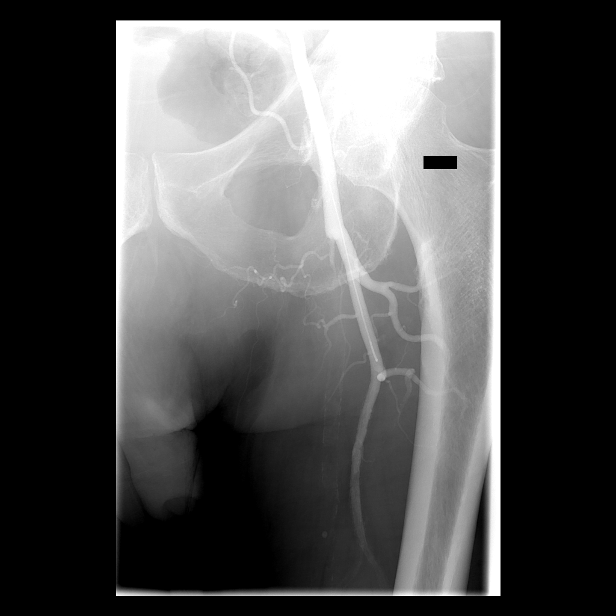

[Series 6: aorta · 1 of 2 slices shown (4 of 4)]
[im 1/2]
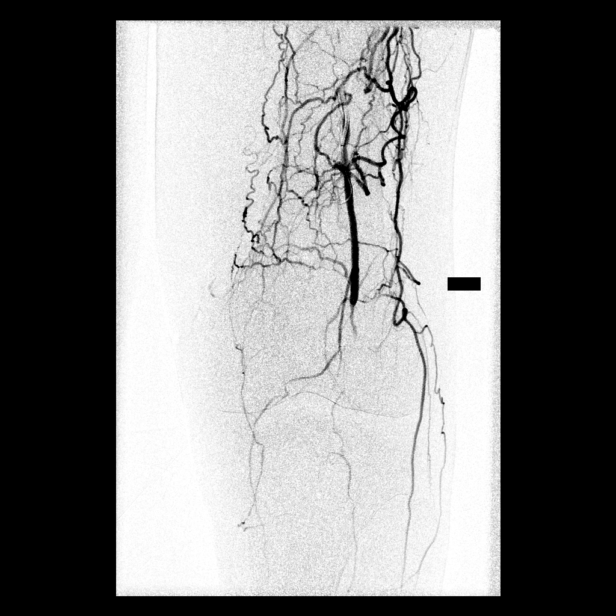

[Series 8: fl  angio · 1 of 1 slices shown (1 of 7)]
[im 1/1]
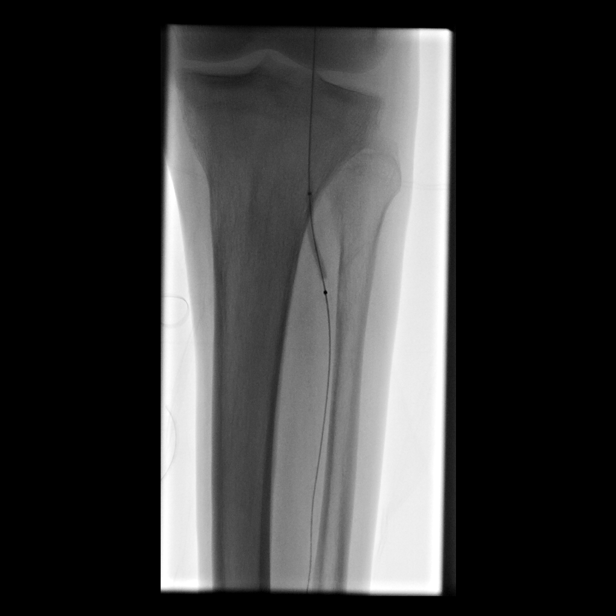

[Series 9: fl  angio · 1 of 1 slices shown (2 of 7)]
[im 1/1]
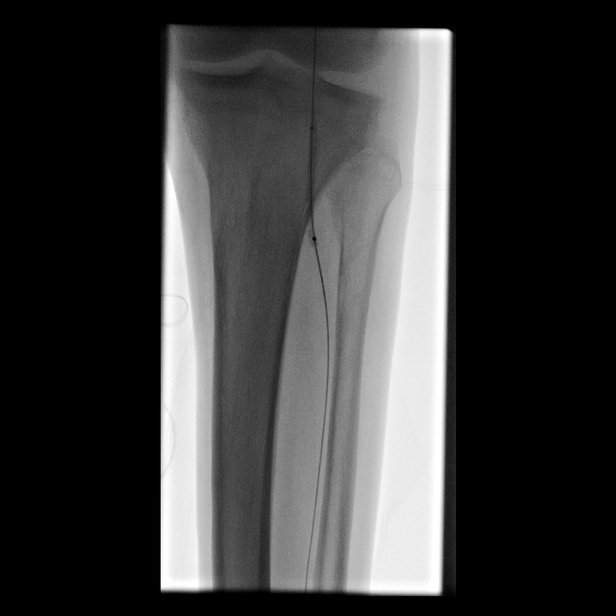

[Series 11: fl  angio · 1 of 1 slices shown (3 of 7)]
[im 1/1]
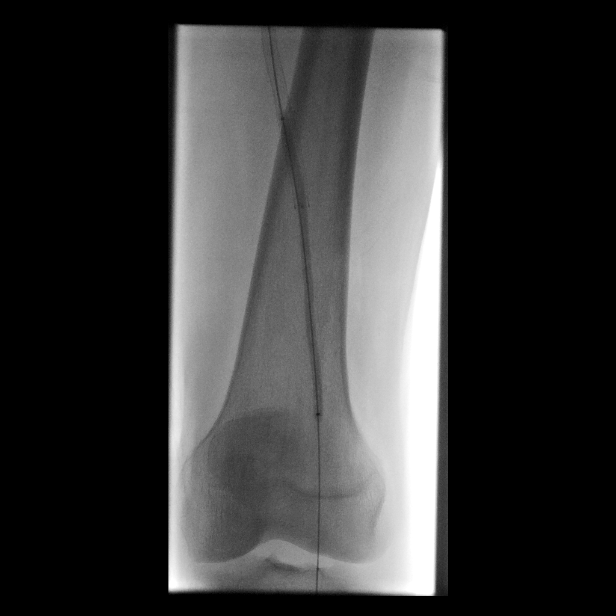

[Series 13: fl  angio · 1 of 1 slices shown (4 of 7)]
[im 1/1]
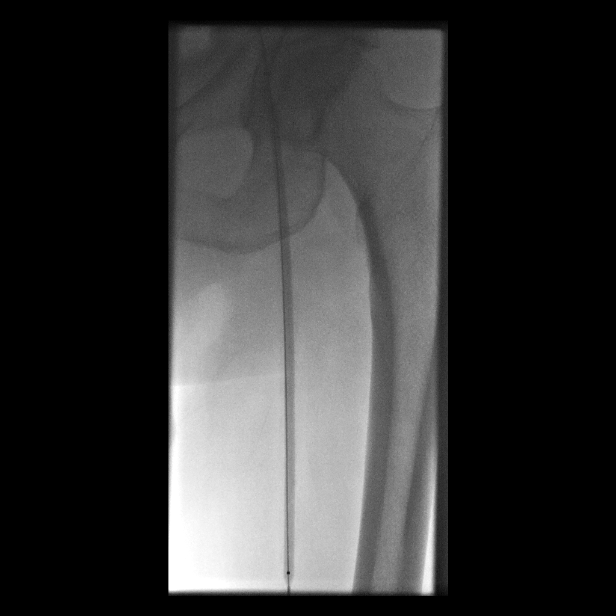

[Series 14: sfa · 1 of 2 slices shown (1 of 4)]
[im 1/2]
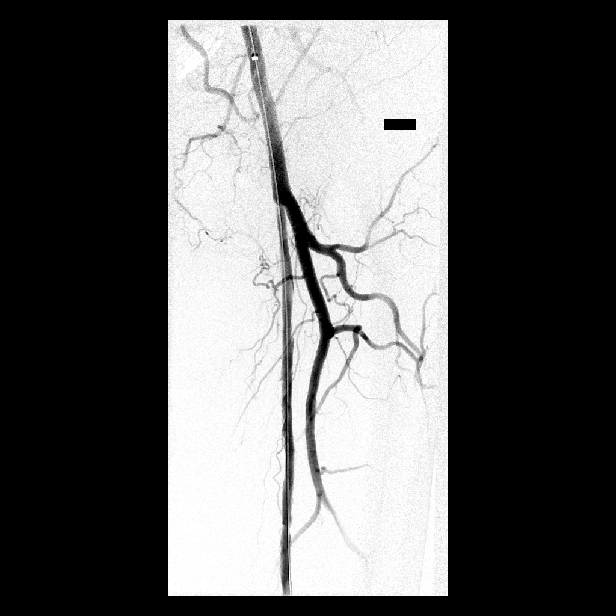

[Series 16: sfa · 1 of 2 slices shown (2 of 4)]
[im 1/2]
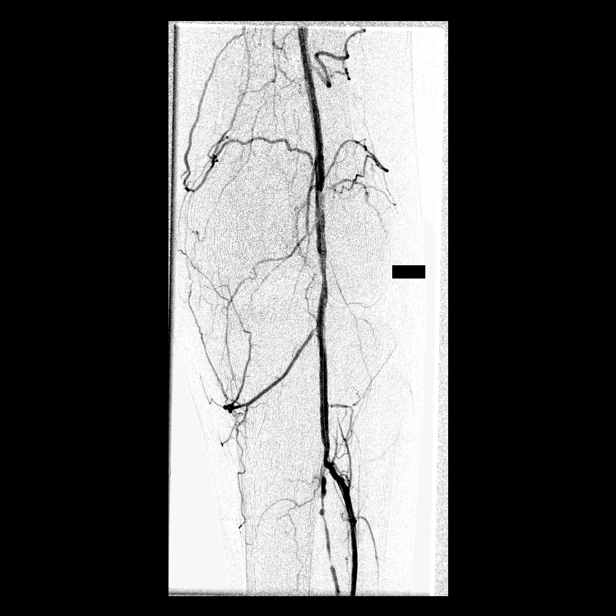

[Series 17: sfa · 1 of 2 slices shown (3 of 4)]
[im 1/2]
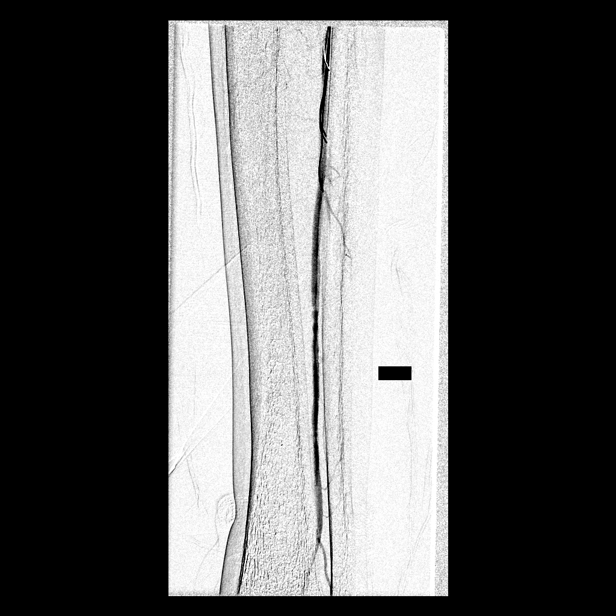

[Series 19: fl  angio · 1 of 1 slices shown (5 of 7)]
[im 1/1]
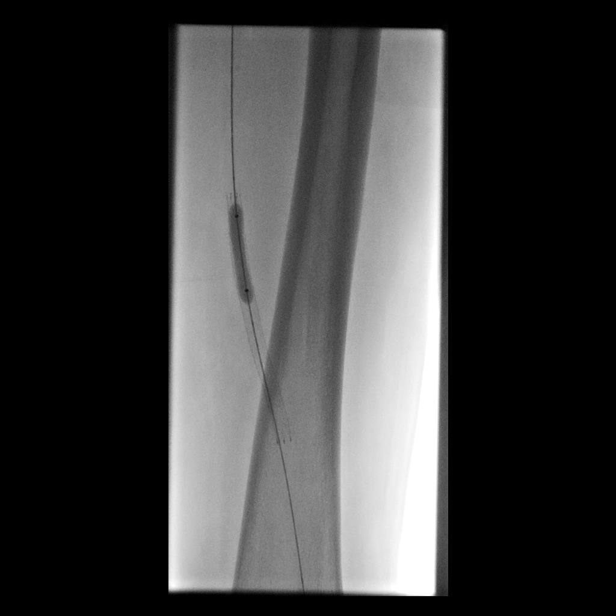

[Series 21: fl  angio · 1 of 1 slices shown (6 of 7)]
[im 1/1]
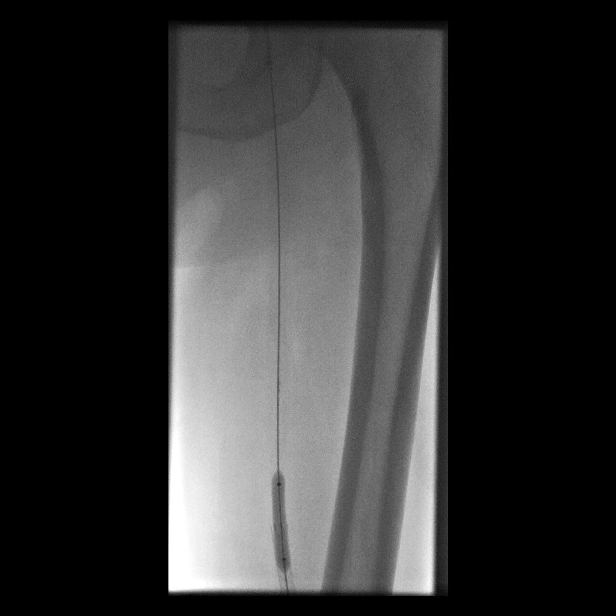

[Series 22: fl  angio · 1 of 1 slices shown (7 of 7)]
[im 1/1]
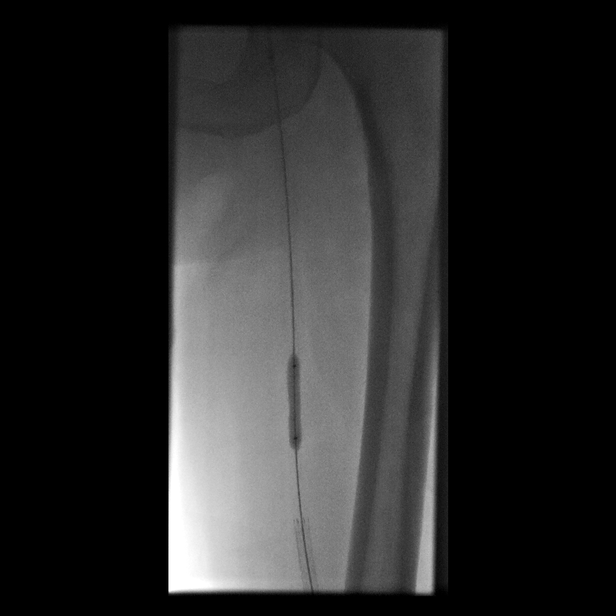

[Series 24: sfa · 1 of 2 slices shown (4 of 4)]
[im 1/2]
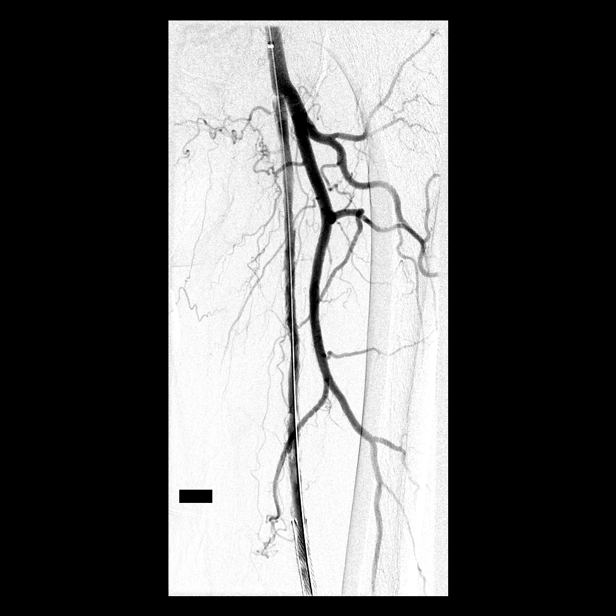

[15 of 24 positions shown; findings below may reference images not displayed]

IMAGES IMPORTED FROM THE SYNGO WORKFLOW SYSTEM
NO DICTATION FOR STUDY

## 2012-04-15 IMAGING — NM NM LUNG SCAN
2 series · 16 of 16 positions shown · non-contrast
Comparison: None

REASON FOR EXAM: Elevated D-Dimer, Chest pain
COMMENTS:

PROCEDURE:     NM  - NM VQ LUNG SCAN  - [DATE] [DATE] [DATE]  [DATE]
RESULT:     Procedure: Ventilation/perfusion lung scan.
INDICATION: Chest pain
TECHNIQUE: Standard dynamic anterior and posterior images were obtained for
the ventilation study; anterior and posterior images in varying degrees of
obliquity were obtained for the perfusion study; the ventilation and
perfusion studies were compared with a recent chest x-ray.

[Series 1000: lung ventilation · 3.90mm/px · 4 acquisitions, 8 frames shown]
[im 1/4]
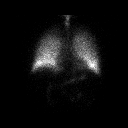
[im 1/4]
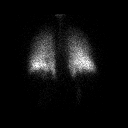
[im 2/4]
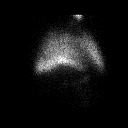
[im 2/4]
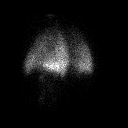
[im 3/4]
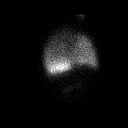
[im 3/4]
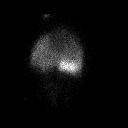
[im 4/4]
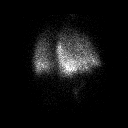
[im 4/4]
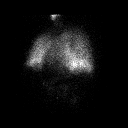

[Series 1000: lung perfusion · 1.95mm/px · 4 acquisitions, 8 frames shown]
[im 1/4]
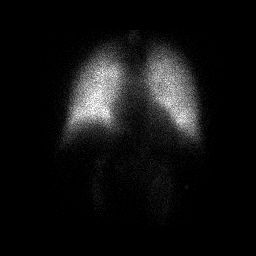
[im 1/4]
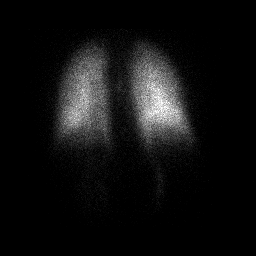
[im 2/4]
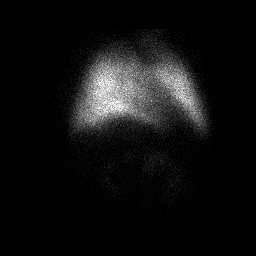
[im 2/4]
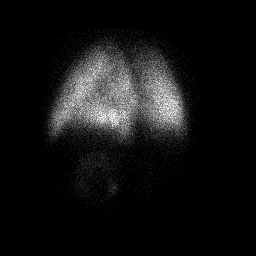
[im 3/4]
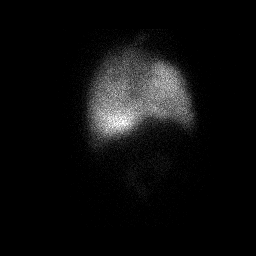
[im 3/4]
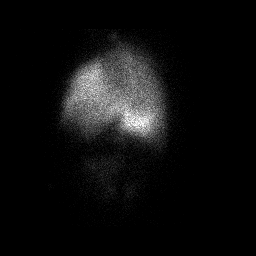
[im 4/4]
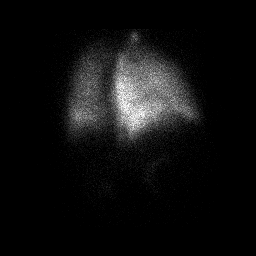
[im 4/4]
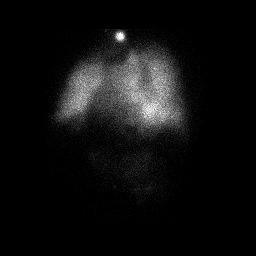

[16 of 16 positions shown; findings below may reference images not displayed]

Radiopharmaceutical: 42.02 mCi Pc-XXm DTPA inhaled gas via a nebulizer for
ventilation; 4.14 mCi Pc-XXm MAA administered intravenously for perfusion.
FINDINGS: PA and lateral Chest X-ray from 12/05/2009 : No acute disease of the chest

Ventilation: There is homogeneous ventilation bilaterally and symmetrically.

Perfusion: There is homogeneous perfusion without a segmental or
subsegmental defect.
IMPRESSION: Normal ventilation/perfusion scan.

## 2012-04-16 IMAGING — CR DG CHEST 2V
1 series · 2 of 2 positions shown · non-contrast
Comparison: none

REASON FOR EXAM: chest pain
COMMENTS:

[Series 1: view not recorded · 0.17mm/px · 2 of 2 slices shown]
[im 1/2]
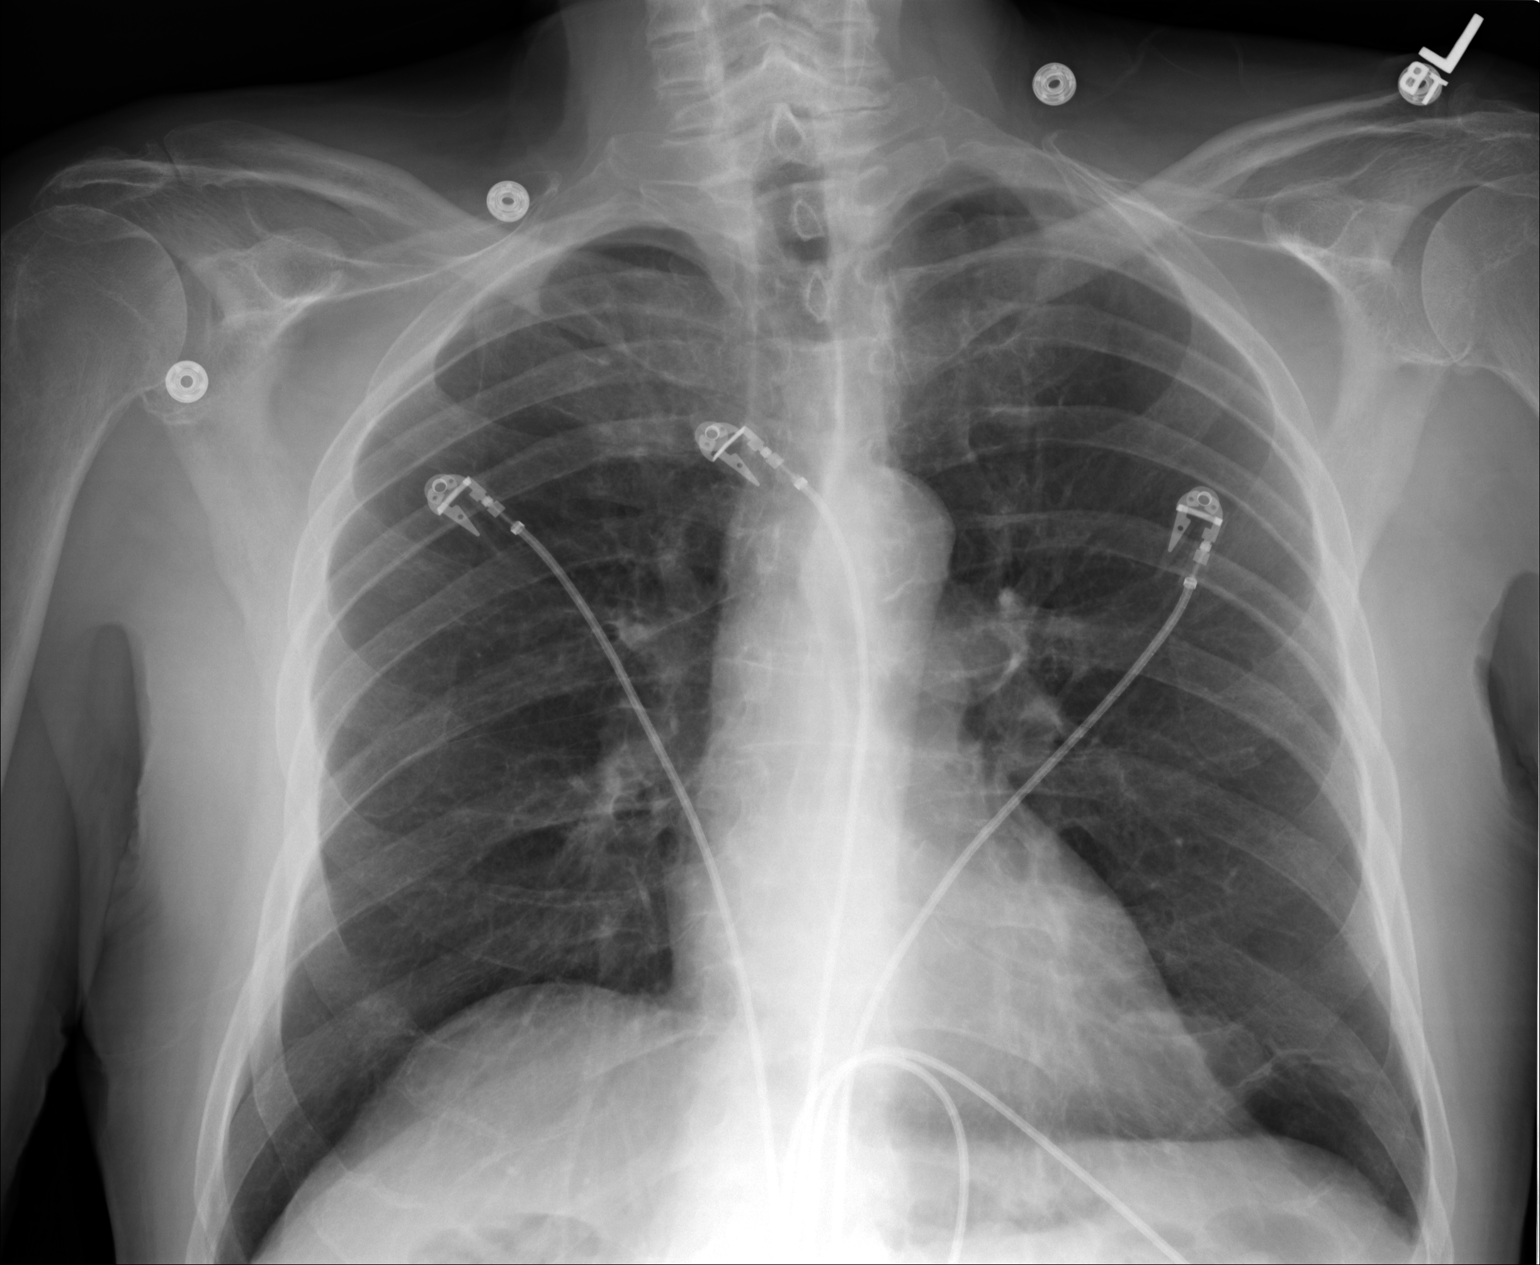
[im 2/2]
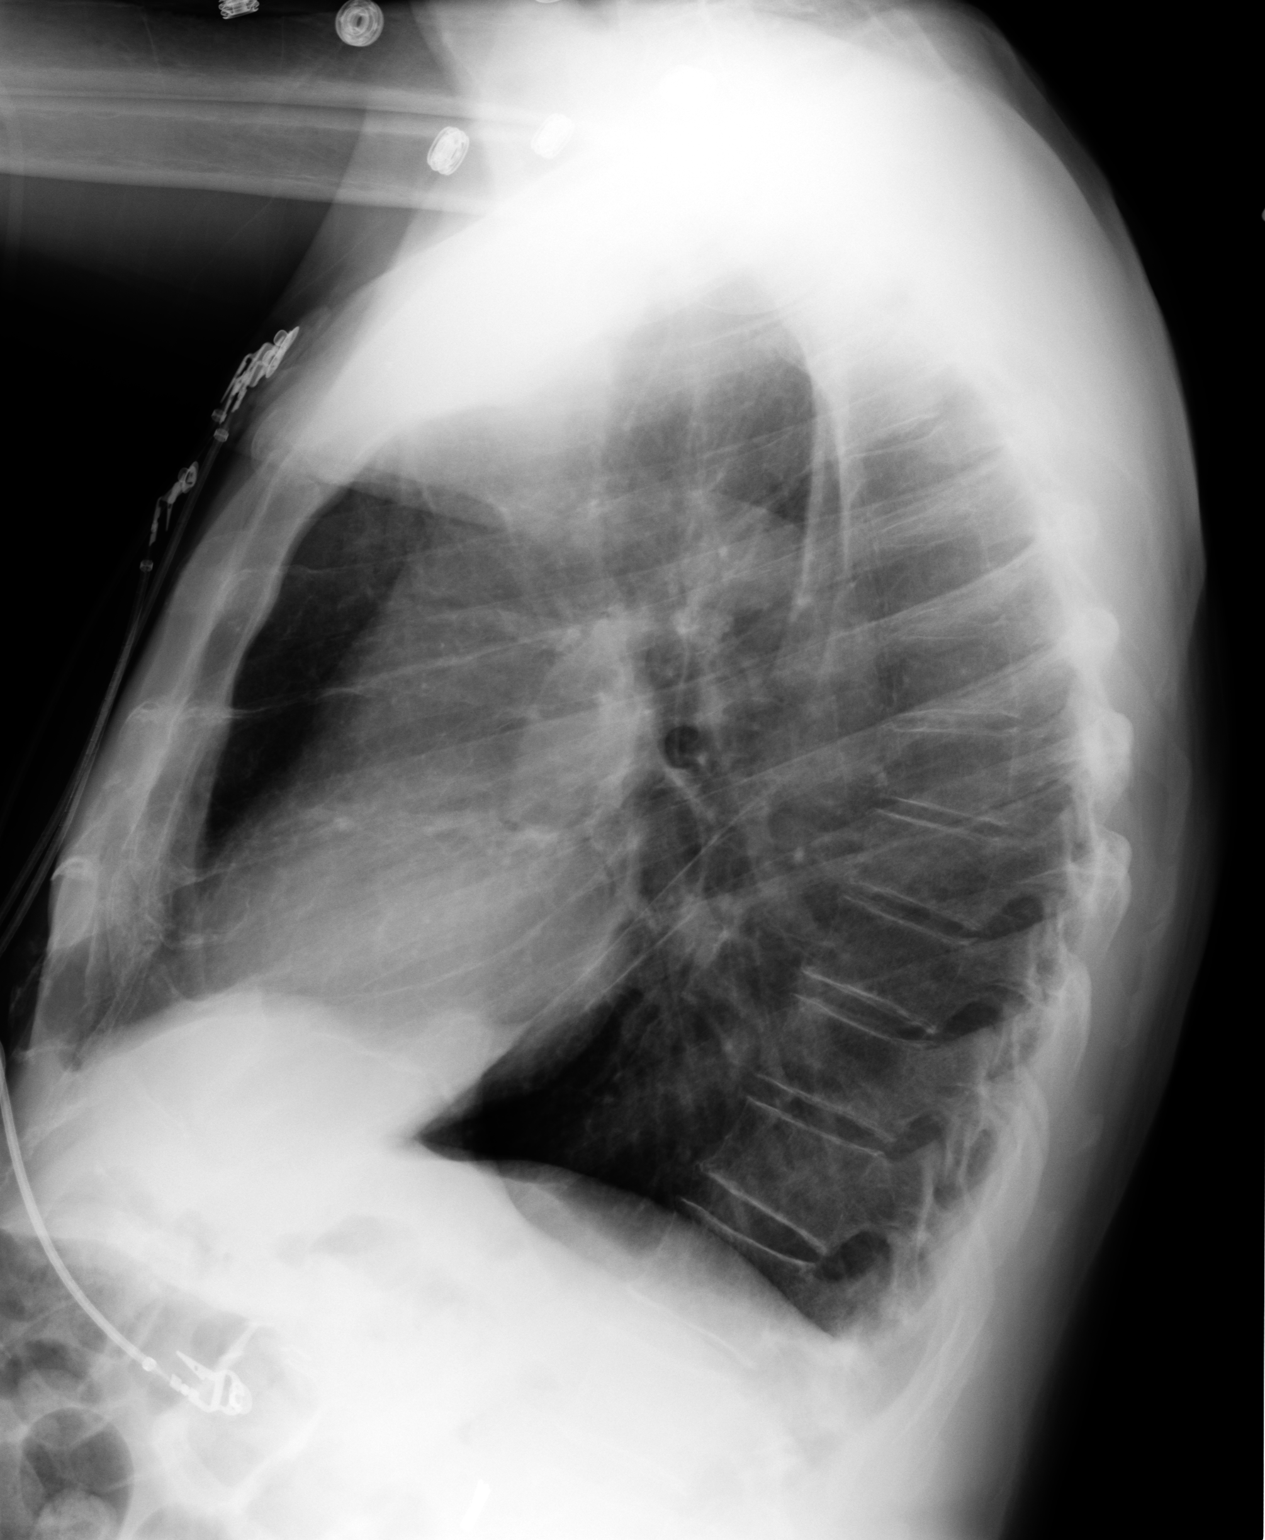

[2 of 2 positions shown; findings below may reference images not displayed]

PROCEDURE:     DXR - DXR CHEST PA (OR AP) AND LATERAL  - December 06, 2009  [DATE]

RESULT:     Comparison is made to the prior exam 12/05/2009. The lung fields
are clear. The heart, mediastinal and osseous structures show no acute
changes. The chest appears mildly hyperexpanded. Monitoring electrodes are
present.
IMPRESSION: 1. The lung fields are clear.
2. The chest appears hyperexpanded.
3. Heart size is normal.

## 2012-08-19 ENCOUNTER — Encounter (INDEPENDENT_AMBULATORY_CARE_PROVIDER_SITE_OTHER): Payer: Medicare Other

## 2012-08-19 DIAGNOSIS — I6529 Occlusion and stenosis of unspecified carotid artery: Secondary | ICD-10-CM

## 2012-08-19 DIAGNOSIS — R42 Dizziness and giddiness: Secondary | ICD-10-CM

## 2012-09-08 ENCOUNTER — Encounter: Payer: Self-pay | Admitting: Cardiovascular Disease

## 2012-09-08 ENCOUNTER — Ambulatory Visit (INDEPENDENT_AMBULATORY_CARE_PROVIDER_SITE_OTHER): Payer: Medicare Other | Admitting: Cardiovascular Disease

## 2012-09-08 VITALS — BP 134/71 | HR 67 | Ht 66.0 in | Wt 156.8 lb

## 2012-09-08 DIAGNOSIS — E119 Type 2 diabetes mellitus without complications: Secondary | ICD-10-CM

## 2012-09-08 DIAGNOSIS — I2510 Atherosclerotic heart disease of native coronary artery without angina pectoris: Secondary | ICD-10-CM

## 2012-09-08 DIAGNOSIS — I471 Supraventricular tachycardia: Secondary | ICD-10-CM

## 2012-09-08 DIAGNOSIS — I739 Peripheral vascular disease, unspecified: Secondary | ICD-10-CM

## 2012-09-08 DIAGNOSIS — E785 Hyperlipidemia, unspecified: Secondary | ICD-10-CM

## 2012-09-08 NOTE — Assessment & Plan Note (Signed)
Hemoglobin A1c of 8. This was discussed with him. He does not want additional medications

## 2012-09-08 NOTE — Patient Instructions (Addendum)
You are doing well. No medication changes were made. Consider starting aspirin 81 mg coated once a day  Please call us if you have new issues that need to be addressed before your next appt.  Your physician wants you to follow-up in: 6 months.  You will receive a reminder letter in the mail two months in advance. If you don't receive a letter, please call our office to schedule the follow-up appointment.

## 2012-09-08 NOTE — Assessment & Plan Note (Signed)
60-79% left carotid arterial disease. This was discussed with him. We'll repeat scan next year. He is not interested in a statin. I encouraged him to take low-dose aspirin

## 2012-09-08 NOTE — Progress Notes (Signed)
Patient ID: [name], male    DOB: 1924/10/18, 79 y.o.   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 He was noted to have elevated cardiac enzymes with CK-MB of 12.7, troponin 0.24 on arrival.  His enzymes peaked and trended down. Given his underlying renal disease, cardiac catheterization was not performed. Peak creatinine was above his baseline at 1.67 while in the hospital despite no contrast dye. He has not had any further chest pain since discharge. Prior to discharge, he had a Myoview, pharmacological that showed no ischemia. He was discharged on medical management.  In the past, he has stopped most of his medications. He does not want to be on any cholesterol medications or blood pressure including beta blockers. Is not even taking aspirin on today's visit. He does report having chronic dizziness since his "MI in 2006". Since that time, he has not felt well. Symptoms come and go, not related to exertion or position.   Recent carotid ultrasound showed 60-79% left carotid disease. Discussed that there had been a mild worsening from his previous study.  He states that he does self catheterizations for his urine. Denies any arrhythmia or palpitations He has mild shortness of breath. Possibly worse over the past year or so.  Sugars have been running higher, hemoglobin  A1c of 8 Total cholesterol 221, LDL 147, HDL 33  EKG shows normal sinus rhythm with rate 67 beats per minute with right bundle branch block, nonspecific ST abnormality leads V3 through V5, 2, 3, aVF   Outpatient Encounter Prescriptions as of 09/08/2012  Medication Sig Dispense Refill  . glipiZIDE (GLUCOTROL) 10 MG tablet Takes 1/2 tablet 3 times daily.      . nitroGLYCERIN (NITROSTAT) 0.4 MG SL tablet Place 1 tablet (0.4 mg total) under the tongue as needed.  25 tablet  6   No facility-administered encounter medications on file as of 09/08/2012.     Review of Systems  Constitutional: Negative.   HENT: Negative.   Eyes: Negative.   Respiratory: Negative.   Cardiovascular: Negative.   Gastrointestinal: Negative.   Musculoskeletal: Positive for gait problem.       Leg weakness  Skin: Negative.   Neurological: Positive for dizziness.  Psychiatric/Behavioral: Negative.   All other systems reviewed and are negative.   BP 134/71  Pulse 67  Ht 5\' 6"  (1.676 m)  Wt 156 lb 12 oz (71.101 kg)  BMI 25.31 kg/m2  Physical Exam  Nursing note and vitals reviewed. Constitutional: He is oriented to person, place, and time. He appears well-developed and well-nourished.  HENT:  Head: Normocephalic.  Nose: Nose normal.  Mouth/Throat: Oropharynx is clear and moist.  Eyes: Conjunctivae are normal. Pupils are equal, round, and reactive to light.  Neck: Normal range of motion. Neck supple. No JVD present. Carotid bruit is present.  Cardiovascular: Normal rate, regular rhythm, S1 normal, S2 normal, normal heart sounds and intact distal pulses.  Exam  reveals no gallop and no friction rub.   No murmur heard. Pulmonary/Chest: Effort normal and breath sounds normal. No respiratory distress. He has no wheezes. He has no rales. He exhibits no tenderness.  Abdominal: Soft. Bowel sounds are normal. He exhibits no distension. There is no tenderness.  Musculoskeletal: He exhibits no edema and no tenderness.   Very thin legs with weakness on the right, leg brace of the right lower extremity  Lymphadenopathy:    He has no cervical adenopathy.  Neurological: He is alert and oriented to person, place, and time. Coordination normal.  Skin: Skin is warm and dry. No rash noted. No erythema.  Psychiatric: He has a normal mood and affect. His behavior is normal. Judgment and thought content normal.      Assessment and Plan

## 2012-09-08 NOTE — Assessment & Plan Note (Signed)
He is not interested in a statin at this time. He has stopped most of his medications

## 2012-09-08 NOTE — Assessment & Plan Note (Signed)
Currently with no symptoms of angina. No further workup at this time. Continue current medication regimen. He does report of some shortness of breath. He is relatively sedentary. I've asked them to call our office if shortness of breath symptoms get worse

## 2012-09-08 NOTE — Assessment & Plan Note (Signed)
He denies any arrhythmia since his last visit

## 2013-03-01 ENCOUNTER — Ambulatory Visit: Payer: Medicare Other | Admitting: Cardiovascular Disease

## 2013-04-29 ENCOUNTER — Ambulatory Visit: Payer: Self-pay

## 2013-11-20 ENCOUNTER — Inpatient Hospital Stay: Payer: Self-pay | Admitting: Internal Medicine

## 2013-11-20 LAB — URINALYSIS, COMPLETE
BLOOD: NEGATIVE
Bilirubin,UR: NEGATIVE
GLUCOSE, UR: NEGATIVE mg/dL (ref 0–75)
Hyaline Cast: 2
KETONE: NEGATIVE
NITRITE: NEGATIVE
Ph: 6 (ref 4.5–8.0)
Protein: 30
SQUAMOUS EPITHELIAL: NONE SEEN
Specific Gravity: 1.015 (ref 1.003–1.030)
WBC UR: 50 /HPF (ref 0–5)

## 2013-11-20 LAB — CBC
HCT: 37.8 % — ABNORMAL LOW (ref 40.0–52.0)
HGB: 12.6 g/dL — ABNORMAL LOW (ref 13.0–18.0)
MCH: 31.6 pg (ref 26.0–34.0)
MCHC: 33.4 g/dL (ref 32.0–36.0)
MCV: 95 fL (ref 80–100)
PLATELETS: 168 10*3/uL (ref 150–440)
RBC: 4 10*6/uL — AB (ref 4.40–5.90)
RDW: 13.2 % (ref 11.5–14.5)
WBC: 4.3 10*3/uL (ref 3.8–10.6)

## 2013-11-20 LAB — COMPREHENSIVE METABOLIC PANEL
ALBUMIN: 3.5 g/dL (ref 3.4–5.0)
ALK PHOS: 73 U/L
ANION GAP: 8 (ref 7–16)
AST: 24 U/L (ref 15–37)
BUN: 39 mg/dL — AB (ref 7–18)
Bilirubin,Total: 0.4 mg/dL (ref 0.2–1.0)
Calcium, Total: 9.1 mg/dL (ref 8.5–10.1)
Chloride: 104 mmol/L (ref 98–107)
Co2: 26 mmol/L (ref 21–32)
Creatinine: 1.82 mg/dL — ABNORMAL HIGH (ref 0.60–1.30)
EGFR (African American): 38 — ABNORMAL LOW
GFR CALC NON AF AMER: 33 — AB
Glucose: 173 mg/dL — ABNORMAL HIGH (ref 65–99)
Osmolality: 289 (ref 275–301)
Potassium: 3.9 mmol/L (ref 3.5–5.1)
SGPT (ALT): 23 U/L
SODIUM: 138 mmol/L (ref 136–145)
TOTAL PROTEIN: 7.3 g/dL (ref 6.4–8.2)

## 2013-11-20 LAB — LIPASE, BLOOD: Lipase: 105 U/L (ref 73–393)

## 2013-11-20 LAB — TROPONIN I: TROPONIN-I: 0.18 ng/mL — AB

## 2013-11-21 DIAGNOSIS — I059 Rheumatic mitral valve disease, unspecified: Secondary | ICD-10-CM

## 2013-11-21 DIAGNOSIS — I4891 Unspecified atrial fibrillation: Secondary | ICD-10-CM

## 2013-11-21 DIAGNOSIS — N183 Chronic kidney disease, stage 3 unspecified: Secondary | ICD-10-CM

## 2013-11-21 DIAGNOSIS — R7989 Other specified abnormal findings of blood chemistry: Secondary | ICD-10-CM

## 2013-11-21 DIAGNOSIS — I959 Hypotension, unspecified: Secondary | ICD-10-CM

## 2013-11-21 LAB — CBC WITH DIFFERENTIAL/PLATELET
BASOS ABS: 0 10*3/uL (ref 0.0–0.1)
Basophil %: 0.2 %
EOS ABS: 0 10*3/uL (ref 0.0–0.7)
EOS PCT: 0.1 %
HCT: 32.1 % — AB (ref 40.0–52.0)
HGB: 10.7 g/dL — ABNORMAL LOW (ref 13.0–18.0)
LYMPHS ABS: 0.2 10*3/uL — AB (ref 1.0–3.6)
Lymphocyte %: 1.2 %
MCH: 31.7 pg (ref 26.0–34.0)
MCHC: 33.3 g/dL (ref 32.0–36.0)
MCV: 95 fL (ref 80–100)
Monocyte #: 0.7 x10 3/mm (ref 0.2–1.0)
Monocyte %: 3.7 %
Neutrophil #: 17.1 10*3/uL — ABNORMAL HIGH (ref 1.4–6.5)
Neutrophil %: 94.8 %
Platelet: 99 10*3/uL — ABNORMAL LOW (ref 150–440)
RBC: 3.36 10*6/uL — AB (ref 4.40–5.90)
RDW: 13.2 % (ref 11.5–14.5)
WBC: 18.1 10*3/uL — ABNORMAL HIGH (ref 3.8–10.6)

## 2013-11-21 LAB — BASIC METABOLIC PANEL
ANION GAP: 10 (ref 7–16)
BUN: 42 mg/dL — ABNORMAL HIGH (ref 7–18)
CALCIUM: 7.9 mg/dL — AB (ref 8.5–10.1)
CO2: 22 mmol/L (ref 21–32)
CREATININE: 2.02 mg/dL — AB (ref 0.60–1.30)
Chloride: 107 mmol/L (ref 98–107)
EGFR (African American): 33 — ABNORMAL LOW
EGFR (Non-African Amer.): 29 — ABNORMAL LOW
GLUCOSE: 225 mg/dL — AB (ref 65–99)
Osmolality: 295 (ref 275–301)
Potassium: 4.6 mmol/L (ref 3.5–5.1)
SODIUM: 139 mmol/L (ref 136–145)

## 2013-11-21 LAB — CK-MB
CK-MB: 2.7 ng/mL (ref 0.5–3.6)
CK-MB: 2.7 ng/mL (ref 0.5–3.6)
CK-MB: 3 ng/mL (ref 0.5–3.6)

## 2013-11-21 LAB — TROPONIN I
TROPONIN-I: 0.3 ng/mL — AB
TROPONIN-I: 0.3 ng/mL — AB
Troponin-I: 0.24 ng/mL — ABNORMAL HIGH
Troponin-I: 0.25 ng/mL — ABNORMAL HIGH
Troponin-I: 0.32 ng/mL — ABNORMAL HIGH

## 2013-11-21 LAB — HEMOGLOBIN A1C: Hemoglobin A1C: 7.5 % — ABNORMAL HIGH (ref 4.2–6.3)

## 2013-11-21 LAB — MAGNESIUM: Magnesium: 1.8 mg/dL

## 2013-11-22 LAB — BASIC METABOLIC PANEL
Anion Gap: 5 — ABNORMAL LOW (ref 7–16)
BUN: 48 mg/dL — AB (ref 7–18)
CHLORIDE: 108 mmol/L — AB (ref 98–107)
CREATININE: 2.22 mg/dL — AB (ref 0.60–1.30)
Calcium, Total: 8.1 mg/dL — ABNORMAL LOW (ref 8.5–10.1)
Co2: 25 mmol/L (ref 21–32)
EGFR (Non-African Amer.): 26 — ABNORMAL LOW
GFR CALC AF AMER: 30 — AB
Glucose: 170 mg/dL — ABNORMAL HIGH (ref 65–99)
Osmolality: 292 (ref 275–301)
POTASSIUM: 4.7 mmol/L (ref 3.5–5.1)
SODIUM: 138 mmol/L (ref 136–145)

## 2013-11-22 LAB — CBC WITH DIFFERENTIAL/PLATELET
BASOS ABS: 0.1 10*3/uL (ref 0.0–0.1)
Basophil %: 0.5 %
Eosinophil #: 0.3 10*3/uL (ref 0.0–0.7)
Eosinophil %: 2.1 %
HCT: 30.3 % — AB (ref 40.0–52.0)
HGB: 10.2 g/dL — AB (ref 13.0–18.0)
Lymphocyte #: 1 10*3/uL (ref 1.0–3.6)
Lymphocyte %: 7.9 %
MCH: 31.6 pg (ref 26.0–34.0)
MCHC: 33.6 g/dL (ref 32.0–36.0)
MCV: 94 fL (ref 80–100)
MONO ABS: 1 x10 3/mm (ref 0.2–1.0)
Monocyte %: 7.3 %
NEUTROS ABS: 10.8 10*3/uL — AB (ref 1.4–6.5)
NEUTROS PCT: 82.2 %
Platelet: 88 10*3/uL — ABNORMAL LOW (ref 150–440)
RBC: 3.22 10*6/uL — AB (ref 4.40–5.90)
RDW: 13.5 % (ref 11.5–14.5)
WBC: 13.1 10*3/uL — ABNORMAL HIGH (ref 3.8–10.6)

## 2013-11-22 LAB — URINE CULTURE

## 2013-11-23 LAB — BASIC METABOLIC PANEL
Anion Gap: 6 — ABNORMAL LOW (ref 7–16)
BUN: 40 mg/dL — ABNORMAL HIGH (ref 7–18)
CALCIUM: 8.3 mg/dL — AB (ref 8.5–10.1)
CO2: 22 mmol/L (ref 21–32)
Chloride: 112 mmol/L — ABNORMAL HIGH (ref 98–107)
Creatinine: 1.84 mg/dL — ABNORMAL HIGH (ref 0.60–1.30)
EGFR (African American): 37 — ABNORMAL LOW
GFR CALC NON AF AMER: 32 — AB
GLUCOSE: 175 mg/dL — AB (ref 65–99)
OSMOLALITY: 293 (ref 275–301)
Potassium: 4.3 mmol/L (ref 3.5–5.1)
SODIUM: 140 mmol/L (ref 136–145)

## 2013-11-23 LAB — PLATELET COUNT: Platelet: 110 10*3/uL — ABNORMAL LOW (ref 150–440)

## 2013-11-24 LAB — BASIC METABOLIC PANEL
ANION GAP: 9 (ref 7–16)
BUN: 34 mg/dL — ABNORMAL HIGH (ref 7–18)
CHLORIDE: 110 mmol/L — AB (ref 98–107)
CO2: 23 mmol/L (ref 21–32)
Calcium, Total: 8.1 mg/dL — ABNORMAL LOW (ref 8.5–10.1)
Creatinine: 1.82 mg/dL — ABNORMAL HIGH (ref 0.60–1.30)
EGFR (African American): 38 — ABNORMAL LOW
GFR CALC NON AF AMER: 33 — AB
GLUCOSE: 157 mg/dL — AB (ref 65–99)
Osmolality: 294 (ref 275–301)
POTASSIUM: 3.9 mmol/L (ref 3.5–5.1)
Sodium: 142 mmol/L (ref 136–145)

## 2013-11-25 LAB — CULTURE, BLOOD (SINGLE)

## 2013-12-15 ENCOUNTER — Telehealth: Payer: Self-pay

## 2013-12-15 NOTE — Telephone Encounter (Signed)
Pt would like to know what kind of stents he has, he needs an MRI and [facility] needs to know.

## 2013-12-15 NOTE — Telephone Encounter (Signed)
Spoke w/ pt.  Advised him that he had a Taxus drug eluding stent placed in 2008. He will notify [facility] and call back w/ further questions or concerns.

## 2014-02-08 ENCOUNTER — Ambulatory Visit: Payer: Self-pay | Admitting: Internal Medicine

## 2014-07-30 NOTE — H&P (Signed)
PATIENT NAME:  [name], [name] MR#:  [id number] DATE OF BIRTH:  06-Jul-1924  DATE OF ADMISSION:  11/20/2013  PRIMARY CARE PHYSICIAN: Stann Mainland. Sampson Goon, MD  REFERRING PHYSICIAN: Kathreen Devoid. Paduchowski, MD  CHIEF COMPLAINT: Shaking chills, nausea, vomiting today.   HISTORY OF PRESENT ILLNESS: An 81 year old Caucasian male with a history of CAD, hypertension, diabetes, PVD, presently in the ED with the above chief complaint. The patient is alert, awake, oriented, in no acute distress. The patient said that he was fine until today. Patient developed shaking and chills at about 4:00 p.m. today. The patient then comes to ED for further evaluation. The patient had nausea and vomited once in ED. The patient denies any chest pain or palpitations. No orthopnea or nocturnal dyspnea. No leg edema. No weight gain or weight loss. The patient has a chronic cough but no hemoptysis. The patient has BPH with self-catheterization twice every day. The patient denies any other symptoms. The patient was noted to have elevated lactic acid of 5, troponin at 0.18. EKG showed atrial fibrillation.   PAST MEDICAL HISTORY: CAD with multiple stents in the past, hypertension, diabetes, CKD with baseline creatinine 1.3, PVD, polio with left-sided weakness and ablation at [facility] in 2011.   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   SOCIAL HISTORY: No smoking, alcohol drinking, or illicit drugs.   FAMILY HISTORY: Father died of CVA complications. Brother had prostate cancer. Mother died at 3 of old age.   ALLERGIES: CONTRAST DYE.   HOME MEDICATIONS: Reconciliation not done yet; we will follow up.   REVIEW OF SYSTEMS:  CONSTITUTIONAL: The patient denies any fever, but has chills and shaking. No headache or dizziness; has generalized weakness.  EYES: No double vision or blurry  vision.  ENT: No postnasal drip, slurred speech, or dysphagia.  CARDIOVASCULAR: No chest pain, palpitation, orthopnea, nocturnal dyspnea. No leg edema.  PULMONARY: No cough, sputum, shortness of breath, or hematemesis.  GASTROINTESTINAL: Lower abdominal pain with nausea and vomiting. No diarrhea. No melena or bloody stool.  GENITOURINARY: No dysuria, hematuria, or incontinence but has difficulty urinating.  NEUROLOGY: No syncope, loss of consciousness, or seizure.  ENDOCRINOLOGY: No polyuria, polydipsia, heat or cold intolerance.  HEMATOLOGY: No easy bruising or bleeding.   PHYSICAL EXAMINATION:  VITAL SIGNS: Temperature 100, blood pressure 114/51, pulse 81, oxygen saturation 100% on room air.  GENERAL: The patient is alert, awake, oriented, in no acute distress.  HEENT: Pupils round, equal and reactive to light and accommodation. Moist oral mucosa. Clear oropharynx.  NECK: Supple. No JVD or carotid bruit. No lymphadenopathy. No thyromegaly.  CARDIOVASCULAR: S1, S2. Irregular rate, rhythm. No murmurs, gallop.  PULMONARY: Bilateral air entry. No wheezing or rales. No use of accessory muscles to breathe.  ABDOMEN: Soft. No distention or tenderness. No organomegaly. Bowel sounds present.  EXTREMITIES: No edema, clubbing, or cyanosis. No calf tenderness. Bilateral pedal pulses present.  SKIN: No rash or jaundice.  NEUROLOGIC: A and O x 3. No focal deficit. Power 5/5. Sensation intact.   LABORATORY DATA: Lactic acid 5.1. Chest x-ray: No acute disease. Urinalysis showed WBC 50, RBC 2, nitrite negative. Glucose 173, BUN 39, creatinine 1.82. Electrolytes normal. WBC 4.2, hemoglobin 12.6, platelets 168,000. Troponin 0.18. EKG showed atrial fibrillation at 81 BPM with a right bundle branch block.   IMPRESSIONS:  1.  Urinary tract infection.  2.  New-onset atrial  fibrillation.  3.  Elevated troponin, possibly due to atrial fibrillation or acute renal failure.  4.  Acute renal failure on chronic  kidney disease. 5.  History of coronary artery disease, hypertension, diabetes, peripheral vascular disease, benign prostatic hypertrophy.   PLAN OF TREATMENT: 1.  The patient will be admitted to the telemetry floor. We will continue to telemetry monitor, give Rocephin IVPB. Follow up urine culture, CBC.  2.  For new-onset atrial fibrillation and elevated troponin, we will start Lovenox 1 mg/kg q. 12 hours, 1 dose now, and continue aspirin, Plavix, Crestor. We will get an echocardiograph and a cardiology consult from Dr. Mariah MillingGollan.  3.  For acute renal failure and dehydration on CKD, we will start normal saline IV, follow up BMP.  4.  For diabetes, we will start sliding scale, follow up hemoglobin A1c.  5.  I discussed the patient's condition and plan of treatment with the patient, the patient's wife, and the son. Patient wants DNR.   TIME SPENT: About 56 minutes.    ____________________________ Shaune PollackQing Jodiann Ognibene, MD qc:ST D: 11/20/2013 21:36:49 ET T: 11/20/2013 23:43:46 ET JOB#: [id number]  cc: Shaune PollackQing Jan Walters, MD, <Dictator> Shaune PollackQING Tylin Stradley MD ELECTRONICALLY SIGNED 11/21/2013 14:48

## 2014-07-30 NOTE — Discharge Summary (Signed)
Dates of Admission and Diagnosis:  Date of Admission 20-Nov-2013   Date of Discharge 24-Nov-2013   Admitting Diagnosis UTI   Final Diagnosis 1. klebsiella UTI 2. IDDM 3. CKD3 4. Acute bronchitis 6. Acute on chronic diastolic chf due to IVF    Chief Complaint/History of Present Illness CHIEF COMPLAINT: Shaking chills, nausea, vomiting today.   HISTORY OF PRESENT ILLNESS: An 81 year old Caucasian male with a history of CAD, hypertension, diabetes, PVD, presently in the ED with the above chief complaint. The patient is alert, awake, oriented, in no acute distress. The patient said that he was fine until today. Patient developed shaking and chills at about 4:00 p.m. today. The patient then comes to ED for further evaluation. The patient had nausea and vomited once in ED. The patient denies any chest pain or palpitations. No orthopnea or nocturnal dyspnea. No leg edema. No weight gain or weight loss. The patient has a chronic cough but no hemoptysis. The patient has BPH with self-catheterization twice every day. The patient denies any other symptoms. The patient was noted to have elevated lactic acid of 5, troponin at 0.18. EKG showed atrial fibrillation.   Allergies:  Contrast dye: Rash  LabObservation:  16-Aug-15 11:16   OBSERVATION Reason for Test  Cardiology:  16-Aug-15 11:16   Echo Doppler REASON FOR EXAM:     COMMENTS:     PROCEDURE: [facility] - ECHO DOPPLER COMPLETE(TRANSTHOR)  - Nov 21 2013 11:16AM   RESULT: Echocardiogram Report  Patient Name:   [name] Date of Exam: 11/21/2013 Medical Rec #:  [id number]          Custom1: Date of Birth:  Dec 17, 1924       Height:       68.9 in Patient Age:    89 years        Weight:       152.1 lb Patient Gender: M               BSA:          1.84 m??  Indications: Atrial Fib Sonographer:    Janalee Dane RCS Referring Phys: Demetrios Loll  Summary:  1. Rhythm is atrial fibrillation.  2. Left ventricular ejection fraction, by visual  estimation, is 55 to  60%.  3. Normal global left ventricular systolic function.  4. Moderate left ventricular hypertrophy.  5. Decreased left ventricular internal cavity size.  6. Normal right ventricular size and systolic function.  7. Moderately dilated left atrium.  8. Mildly dilated right atrium.  9. Mild mitral valve regurgitation. 10. Mild tricuspid regurgitation. 11. Mildly elevated pulmonary artery systolic pressure. 2D AND M-MODE MEASUREMENTS (normal ranges within parentheses): Left Ventricle:          Normal IVSd (2D):      1.43 cm (0.7-1.1) LVPWd (2D):     1.56 cm (0.7-1.1) Aorta/LA:                  Normal LVIDd (2D):     3.34 cm (3.4-5.7) Aortic Root (2D): 3.80 cm (2.4-3.7) LVIDs (2D):     2.60 cm           Left Atrium (2D): 4.30 cm (1.9-4.0) LV FS (2D):     22.3 %   (>25%) LV EF (2D):     46.0 %   (>50%)  Right Ventricle:                                  RVd (2D): LV DIASTOLIC FUNCTION: MV Peak E: 0.63 m/s Decel Time: 377 msec MV Peak A: 0.23 m/s E/A Ratio: 2.74 SPECTRAL DOPPLER ANALYSIS (where applicable): Mitral Valve: MV P1/2 Time: 109.33 msec MV Area, PHT:2.01 cm?? Tricuspid Valve and PA/RV Systolic Pressure: TR Max Velocity: 2.46 m/s RA  Pressure: 10 mmHg RVSP/PASP: 34.1 mmHg  PHYSICIAN INTERPRETATION: Left Ventricle: The left ventricular internal cavity size was decreased.  Moderate left ventricular hypertrophy. Global LV systolic function was  normal. Left ventricular ejection fraction, by visual estimation, is 55  to 60%. Right Ventricle: Normal right ventricular size, wall thickness, and  systolic function. The right ventricular sizeis normal. Global RV  systolic function is normal. Left Atrium: The left atrium is moderately dilated. Right Atrium: The right atrium is mildly dilated. Pericardium: There is no evidence of pericardial effusion. Mitral Valve: The mitral valve isnormal in structure. Mild mitral valve   regurgitation is seen. Tricuspid Valve: The tricuspid valve is normal. Mild tricuspid  regurgitation is visualized. The tricuspid regurgitant velocity is 2.46  m/s, and with an assumed right atrial pressureof 10 mmHg, the estimated  right ventricular systolic pressure is mildly elevated at 34.1 mmHg. Aortic Valve: The aortic valve is normal. Mild to moderate aortic valve  sclerosis/calcification is present, without any evidence of aortic  stenosis.No evidence of aortic valve regurgitation is seen. Pulmonic Valve: The pulmonic valve is normal. Trace pulmonic valve  regurgitation. Aorta: The aortic root and ascending aorta are structurally normal, with  no evidence of dilitation. [id number] Ida Rogue MD Electronically signed by [id number] Ida Rogue MD Signature Date/Time: 11/21/2013/12:12:02 PM  *** Final ***  IMPRESSION: .    Verified By: Minna Merritts, M.D., MD  Routine Chem:  16-Aug-15 03:04   Glucose, Serum  225  BUN  42  Creatinine (comp)  2.02  Sodium, Serum 139  Potassium, Serum 4.6  Chloride, Serum 107  CO2, Serum 22  Calcium (Total), Serum  7.9  Anion Gap 10  Osmolality (calc) 295  eGFR (African American)  33  eGFR (Non-African American)  29 (eGFR values <74m/min/1.73 m2 may be an indication of chronic kidney disease (CKD). Calculated eGFR is useful in patients with stable renal function. The eGFR calculation will not be reliable in acutely ill patients when serum creatinine is changing rapidly. It is not useful in  patients on dialysis. The eGFR calculation may not be applicable to patients at the low and high extremes of body sizes, pregnant women, and vegetarians.)  Result Comment TROPONIN - RESULTS VERIFIED BY REPEAT TESTING.  - ELEVATED TROPONIN PREVIOUSLY CALLED AT  - 2002 11/20/13.[facility]  Result(s) reported on 21 Nov 2013 at 04:28AM.  Magnesium, Serum 1.8 (1.8-2.4 THERAPEUTIC RANGE: 4-7 mg/dL TOXIC: > 10 mg/dL  -----------------------)  Hemoglobin  A1c ([facility])  7.5 (The American Diabetes Association recommends that a primary goal of therapy should be <7% and that physicians should reevaluate the treatment regimen in patients with HbA1c values consistently >8%.)    09:19   Result Comment troponin - RESULTS VERIFIED BY REPEAT TESTING.  - Previously called @ 2002 on 11/20/13 by  - PMH.SFJ  Result(s) reported on 21 Nov 2013 at 10:14AM.    12:35   Result Comment troponin - RESULTS VERIFIED BY REPEAT TESTING.  - Previouslly called @ 2002 on 11/20/13  -  by [facility].SFJ  Result(s) reported on 21 Nov 2013 at 01:17PM.    17:05   Result Comment TROPONIN - RESULTS VERIFIED BY REPEAT TESTING.  - PREVIOUSLY CALLED TO ELIZABETH  - BAER 11/20/13 AT 2002 BY [facility]/JEM  Result(s) reported on 21 Nov 2013 at 05:54PM.  17-Aug-15 05:15   Glucose, Serum  170  BUN  48  Creatinine (comp)  2.22  Sodium, Serum 138  Potassium, Serum 4.7  Chloride, Serum  108  CO2, Serum 25  Calcium (Total), Serum  8.1  Anion Gap  5  Osmolality (calc) 292  eGFR (African American)  30  eGFR (Non-African American)  26 (eGFR values <90m/min/1.73 m2 may be an indication of chronic kidney disease (CKD). Calculated eGFR is useful in patients with stable renal function. The eGFR calculation will not be reliable in acutely ill patients when serum creatinine is changing rapidly. It is not useful in  patients on dialysis. The eGFR calculation may not be applicable to patients at the low and high extremes of body sizes, pregnant women, and vegetarians.)  18-Aug-15 07:36   Glucose, Serum  175  BUN  40  Creatinine (comp)  1.84  Sodium, Serum 140  Potassium, Serum 4.3  Chloride, Serum  112  CO2, Serum 22  Calcium (Total), Serum  8.3  Anion Gap  6  Osmolality (calc) 293  eGFR (African American)  37  eGFR (Non-African American)  32 (eGFR values <650mmin/1.73 m2 may be an indication of chronic kidney disease (CKD). Calculated eGFR is useful in patients with stable renal  function. The eGFR calculation will not be reliable in acutely ill patients when serum creatinine is changing rapidly. It is not useful in  patients on dialysis. The eGFR calculation may not be applicable to patients at the low and high extremes of body sizes, pregnant women, and vegetarians.)  19-Aug-15 04:33   Glucose, Serum  157  BUN  34  Creatinine (comp)  1.82  Sodium, Serum 142  Potassium, Serum 3.9  Chloride, Serum  110  CO2, Serum 23  Calcium (Total), Serum  8.1  Anion Gap 9  Osmolality (calc) 294  eGFR (African American)  38  eGFR (Non-African American)  33 (eGFR values <608min/1.73 m2 may be an indication of chronic kidney disease (CKD). Calculated eGFR is useful in patients with stable renal function. The eGFR calculation will not be reliable in acutely ill patients when serum creatinine is changing rapidly. It is not useful in  patients on dialysis. The eGFR calculation may not be applicable to patients at the low and high extremes of body sizes, pregnant women, and vegetarians.)  Cardiac:  16-Aug-15 03:04   Troponin I  0.25 (0.00-0.05 0.05 ng/mL or less: NEGATIVE  Repeat testing in 3-6 hrs  if clinically indicated. >0.05 ng/mL: POTENTIAL  MYOCARDIAL INJURY. Repeat  testing in 3-6 hrs if  clinically indicated. NOTE: An increase or decrease  of 30% or more on serial  testing suggests a  clinically important change)    09:19   Troponin I  0.30 (0.00-0.05 0.05 ng/mL or less: NEGATIVE  Repeat testing in 3-6 hrs  if clinically indicated. >0.05 ng/mL: POTENTIAL  MYOCARDIAL INJURY. Repeat  testing in 3-6 hrs if  clinically indicated. NOTE: An increase or decrease  of 30% or more on serial  testing suggests a  clinically important change)  CPK-MB, Serum 2.7 (Result(s) reported on 21 Nov 2013 at 10:44AM.)    12:35   Troponin I  0.30 (0.00-0.05 0.05 ng/mL or less:  NEGATIVE  Repeat testing in 3-6 hrs  if clinically indicated. >0.05 ng/mL: POTENTIAL   MYOCARDIAL INJURY. Repeat  testing in 3-6 hrs if  clinically indicated. NOTE: An increase or decrease  of 30% or more on serial  testing suggests a  clinically important change)  CPK-MB, Serum 2.7 (Result(s) reported on 21 Nov 2013 at 01:14PM.)    17:05   Troponin I  0.32 (0.00-0.05 0.05 ng/mL or less: NEGATIVE  Repeat testing in 3-6 hrs  if clinically indicated. >0.05 ng/mL: POTENTIAL  MYOCARDIAL INJURY. Repeat  testing in 3-6 hrs if  clinically indicated. NOTE: An increase or decrease  of 30% or more on serial  testing suggests a  clinically important change)  CPK-MB, Serum 3.0 (Result(s) reported on 21 Nov 2013 at 05:44PM.)  Routine Hem:  16-Aug-15 03:04   Platelet Count (CBC)  99  WBC (CBC)  18.1  RBC (CBC)  3.36  Hemoglobin (CBC)  10.7  Hematocrit (CBC)  32.1  MCV 95  MCH 31.7  MCHC 33.3  RDW 13.2  Neutrophil % 94.8  Lymphocyte % 1.2  Monocyte % 3.7  Eosinophil % 0.1  Basophil % 0.2  Neutrophil #  17.1  Lymphocyte #  0.2  Monocyte # 0.7  Eosinophil # 0.0  Basophil # 0.0 (Result(s) reported on 21 Nov 2013 at 03:56AM.)  17-Aug-15 05:15   Platelet Count (CBC)  88  WBC (CBC)  13.1  RBC (CBC)  3.22  Hemoglobin (CBC)  10.2  Hematocrit (CBC)  30.3  MCV 94  MCH 31.6  MCHC 33.6  RDW 13.5  Neutrophil % 82.2  Lymphocyte % 7.9  Monocyte % 7.3  Eosinophil % 2.1  Basophil % 0.5  Neutrophil #  10.8  Lymphocyte # 1.0  Monocyte # 1.0  Eosinophil # 0.3  Basophil # 0.1 (Result(s) reported on 22 Nov 2013 at 05:47AM.)  18-Aug-15 07:36   Platelet Count (CBC)  110 (Result(s) reported on 23 Nov 2013 at 10:10AM.)   PERTINENT RADIOLOGY STUDIES: XRay:    15-Aug-15 19:41, Chest Portable Single View  Chest Portable Single View   REASON FOR EXAM:    chills/weak  COMMENTS:       PROCEDURE: DXR - DXR PORTABLE CHEST SINGLE VIEW  - Nov 20 2013  7:41PM     CLINICAL DATA:  Chills and weakness.    EXAM:  PORTABLE CHEST - 1 VIEW    COMPARISON:  Single view of the chest  10/29/2010. CTchest  04/29/2013.    FINDINGS:  The lungs are clear. Heart size is normal. No pneumothorax or  pleural effusion.     IMPRESSION:  No acute disease.      Electronically Signed    By: Inge Rise M.D.    On: 11/20/2013 19:43         Verified By: Ramond Dial, M.D.,    18-Aug-15 10:31, Chest PA and Lateral  Chest PA and Lateral   REASON FOR EXAM:    SOB  COMMENTS:       PROCEDURE: DXR - DXR CHEST PA (OR AP) AND LATERAL  - Nov 23 2013 10:31AM     CLINICAL DATA:  Shortness of breath, cough.    EXAM:  CHEST  2 VIEW    COMPARISON:  November 20, 2013.    FINDINGS:  Stable cardiomediastinal silhouette. No pneumothorax or pleural  effusion is noted. Narrowing of right subacromial space is noted  suggesting rotator cuff injury. No pneumothorax is noted. Mild  bilateral pleural  effusions are noted posteriorly. There is interval  development of mild central pulmonary vascular congestion and  probable perihilar edema.     IMPRESSION:  Interval development of mild central pulmonary vascular congestion  and mild perihilar edema with associated pleural effusions.      Electronically Signed    By: Sabino Dick M.D.    On: 11/23/2013 10:37       Verified By: Marveen Reeks, M.D.,  [place]:    16-Aug-15 14:23, US Kidney Bilateral  US Kidney Bilateral   REASON FOR EXAM:    arf  COMMENTS:       PROCEDURE: [place]  - US KIDNEY  - Nov 21 2013  2:23PM     CLINICAL DATA:  Acute renal failure    EXAM:  RENAL/URINARY TRACT ULTRASOUND COMPLETE    COMPARISON:  04/20/2008    FINDINGS:  Right Kidney:  Surgically absent    Left Kidney:    Length: 11.4 cm. Suboptimally visualized secondary to body habitus.  Cortical thinning. Upper normal cortical echogenicity. Hypoechoic  structure at inferior pole potentially represents a cyst 2.4 x 2.8 x  2.8 cm, containing scattered low level internal echoes; this  measured 3.4 x 2.5 x 3.0 cm on prior exam. No additional  mass or  hydronephrosis. Question minimal perinephric fluid.    Bladder:    Decompressed, wall appears thickened of this could be an artifact  from underdistention.   IMPRESSION:  Surgical absence of RIGHT kidney.    Cyst at inferior pole LEFT kidney smaller than on previous exam from  2010.    Question bladder wall thickening versus artifact from  underdistention.      Electronically Signed    By: Lavonia Dana M.D.    On: 11/21/2013 14:42       Verified By: Burnetta Sabin, M.D.,  [place]:    15-Aug-15 19:41, Chest Portable Single View  PACS Image     16-Aug-15 14:23, US Kidney Bilateral  PACS Image     18-Aug-15 10:31, Chest PA and Lateral  PACS Image    Pertinent Past History:  Pertinent Past History PAST MEDICAL HISTORY: CAD with multiple stents in the past, hypertension, diabetes, CKD with baseline creatinine 1.3, PVD, polio with left-sided weakness and ablation at [facility] in 2011.   Hospital Course:  Hospital Course 40 with CAD, HTN, CKD here with UTI  * Acute on chronic diastolic chf Likely from IVF. Cr improved but now seems to have pulm edema. Will treat with lasix. Likely d/c AM. I/Os  * ARF over CKD Improving. Due to UTI, infection. IVF held due to pulm edema Monitor I/Os. repeat labs in AM  * klebsiella UTI On Abx. Afebrile now.  * hx CAD/stents on plavix, statin  * Elevated troponin due to demand ischemia  * Acute on chronic diastolic chf due to IVF Resolved with lasix  Multiple PACs at discharge thought to be afib initially but cardiology has seen pt and medication changes done  Time spent on d/c 40 min   Condition on Discharge Fair   Code Status:  Code Status No Code/Do Not Resuscitate   PHYSICAL EXAM ON DISCHARGE:  Physical Exam:  GEN no acute distress, thin   HEENT pink conjunctivae   NECK supple  No masses   RESP normal resp effort  clear BS   CARD regular rate   ABD soft  normal BS   LYMPH negative neck    EXTR negative cyanosis/clubbing, negative edema  PSYCH alert, A+O to time, place, person   DISCHARGE INSTRUCTIONS HOME MEDS:  Medication Reconciliation: Patient's Home Medications at Discharge:     Medication Instructions  fluoxetine 10 mg oral capsule  1 cap(s) orally once a day, As Needed   glipizide 10 mg oral tablet  0.5 tab (6m) orally once a day (in the evening)   levemir flextouch 100 units/ml subcutaneous solution  8 unit(s) subcutaneous if blood sugar is over 150. Blood sugar under 150 0-6 units.   aspirin 81 mg oral tablet  1 tab(s) orally once a day   cipro 250 mg oral tablet  1 tab(s) orally 2 times a day   proair hfa 90 mcg/inh inhalation aerosol  2 puff(s) inhaled 4 times a day, As Needed - for Shortness of Breath   prednisone 5 mg oral tablet  Start at 30 mg and taper by 5 mg daily until complete     Physician's Instructions:  Home Health? Yes   Home Health Service Physicial Therapy   Diet Low Sodium  Carbohydrate Controlled (ADA) Diet   Activity Limitations As tolerated   Return to Work Not Applicable   Time frame for Follow Up Appointment 1-2 weeks  JGeorgie ChardMD   Other Comments Home health PT set up at d/c   Electronic Signatures: Torryn Fiske, SLottie Dawson(MD)  (Signed 20-Aug-15 15:26)  Authored: ADMISSION DATE AND DIAGNOSIS, CHIEF COMPLAINT/HPI, Allergies, PERTINENT LABS, PERTINENT RADIOLOGY STUDIES, PERTINENT PAST HISTORY, HOSPITAL COURSE, PHYSICAL EXAM ON DISCHARGE, [place] PATIENT INSTRUCTIONS   Last Updated: 20-Aug-15 15:26 by SAlba Destine(MD)

## 2014-07-30 NOTE — Consult Note (Signed)
General Aspect 81 year old Caucasian male with a history of medication noncompliance, chronic dizziness, coronary artery disease, previous multiple stent placement with initial stent in 2006 in [place], 2008 at [facility], catheter in August 2011 showing diffuse coronary artery disease, history of diabetes, 60-70% left internal carotid arterial disease, sleep apnea (noncompliant with CPAP),  hypertension, peripheral vascular disease, one kidney with chronic underlying kidney disease, history of polio with weakness on the left , presenting with  shaking and chills at about 4:00 p.m. yesterday,  presenting to the ED for further evaluation. Cardiology was consulted for possible atrial fibrillation.   The patient has BPH with self-catheterization twice every day for the past 4 years. He noticed a drop in his BP over the past week but felt ok. Yesterday had rigors, chills, malaise. Wife gave him glucose thinking it was low sugar level. When he had no improvement, he presented to the ER.  The patient had nausea and vomited  in ED. The patient denies any chest pain or palpitations. No orthopnea or nocturnal dyspnea.   The patient denies any other symptoms. The patient was noted to have elevated lactic acid of 5, troponin at 0.18. EKG read as atrial fibrillation (is actually NSR with APC) On tele he has shown NSR with APCs.   Present Illness . Previously presented to the hospital at [facility] on October 29 2010 with chest pain after he had a conversation with the [employer] on the phone. He was noted to have elevated cardiac enzymes with CK-MB of 12.7, troponin 0.24 . His enzymes peaked and trended down. Given his underlying renal disease, cardiac catheterization was not performed.  he had a Myoview, pharmacological that showed no ischemia. He was discharged on medical management.  In the past, he has stopped most of his medications. He does not want to be on any cholesterol medications or blood pressure including beta  blockers. Is not even taking aspirin.  He does report having chronic dizziness since his "MI in 2006". Since that time, he has not felt well. Symptoms come and go, not related to exertion or position.   SURGICAL HISTORY:  Donated part of pancreas for daughter in 1982, donated the right kidney in 1981, testicle removed, right rotator cuff surgery, cholecystectomy, right leg fracture repair.   SOCIAL HISTORY:  No smoking, alcohol drinking, or illicit drugs.   FAMILY HISTORY:  Father died of CVA complications. Brother had prostate cancer. Mother died at 50 of old age.   Physical Exam:  GEN well developed, well nourished, no acute distress   HEENT hearing intact to voice, moist oral mucosa   NECK supple  No masses   RESP normal resp effort  clear BS   CARD Regular rate and rhythm  Murmur   Murmur Systolic   Systolic Murmur Out flow   ABD denies tenderness  soft   LYMPH negative neck   EXTR negative edema   SKIN normal to palpation   NEURO motor/sensory function intact   PSYCH alert, A+O to time, place, person, good insight   Review of Systems:  Subjective/Chief Complaint weak, dysuria   General: Fatigue  Weakness   Skin: No Complaints   ENT: No Complaints   Eyes: No Complaints   Neck: No Complaints   Respiratory: No Complaints   Cardiovascular: No Complaints   Gastrointestinal: No Complaints   Genitourinary: Frequent urination  Burning or painful urination   Vascular: No Complaints   Musculoskeletal: No Complaints   Neurologic: No Complaints   Hematologic:  No Complaints   Endocrine: No Complaints   Psychiatric: No Complaints   Review of Systems: All other systems were reviewed and found to be negative   Medications/Allergies Reviewed Medications/Allergies reviewed     Atrial Fibrillation:    Stent, Cardiac: Feb 2008   kidney donation (L):    40% pancreas donation:    Gall bladder removal:    Stent - Cardiac: Feb 2006       Admit  Diagnosis:   UTI URINARY TRACT INFECTION.: Onset Date: 21-Nov-2013, Status: Active, Description: UTI URINARY TRACT INFECTION.  Home Medications: Medication Instructions Status  FLUoxetine 10 mg oral capsule 1 cap(s) orally once a day, As Needed Active  glipiZIDE 10 mg oral tablet 0.5 tab (85m) orally once a day (in the evening) Active  Levemir FlexTouch 100 units/mL subcutaneous solution 8 unit(s) subcutaneous if blood sugar is over 150. Blood sugar under 150 0-6 units. Active   Lab Results:  Routine Chem:  16-Aug-15 03:04   Result Comment TROPONIN - RESULTS VERIFIED BY REPEAT TESTING.  - ELEVATED TROPONIN PREVIOUSLY CALLED AT  - 2002 11/20/13.PMH  Result(s) reported on 21 Nov 2013 at 04:28AM.  Glucose, Serum  225  BUN  42  Creatinine (comp)  2.02  Sodium, Serum 139  Potassium, Serum 4.6  Chloride, Serum 107  CO2, Serum 22  Calcium (Total), Serum  7.9  Anion Gap 10  Osmolality (calc) 295  eGFR (African American)  33  eGFR (Non-African American)  29 (eGFR values <650mmin/1.73 m2 may be an indication of chronic kidney disease (CKD). Calculated eGFR is useful in patients with stable renal function. The eGFR calculation will not be reliable in acutely ill patients when serum creatinine is changing rapidly. It is not useful in  patients on dialysis. The eGFR calculation may not be applicable to patients at the low and high extremes of body sizes, pregnant women, and vegetarians.)  Magnesium, Serum 1.8 (1.8-2.4 THERAPEUTIC RANGE: 4-7 mg/dL TOXIC: > 10 mg/dL  -----------------------)  Hemoglobin A1c ([facility])  7.5 (The American Diabetes Association recommends that a primary goal of therapy should be <7% and that physicians should reevaluate the treatment regimen in patients with HbA1c values consistently >8%.)  Cardiac:  15-Aug-15 19:04   Troponin I  0.18 (0.00-0.05 0.05 ng/mL or less: NEGATIVE  Repeat testing in 3-6 hrs  if clinically indicated. >0.05 ng/mL: POTENTIAL   MYOCARDIAL INJURY. Repeat  testing in 3-6 hrs if  clinically indicated. NOTE: An increase or decrease  of 30% or more on serial  testing suggests a  clinically important change)    23:11   Troponin I  0.24 (0.00-0.05 0.05 ng/mL or less: NEGATIVE  Repeat testing in 3-6 hrs  if clinically indicated. >0.05 ng/mL: POTENTIAL  MYOCARDIAL INJURY. Repeat  testing in 3-6 hrs if  clinically indicated. NOTE: An increase or decrease  of 30% or more on serial  testing suggests a  clinically important change)  16-Aug-15 03:04   Troponin I  0.25 (0.00-0.05 0.05 ng/mL or less: NEGATIVE  Repeat testing in 3-6 hrs  if clinically indicated. >0.05 ng/mL: POTENTIAL  MYOCARDIAL INJURY. Repeat  testing in 3-6 hrs if  clinically indicated. NOTE: An increase or decrease  of 30% or more on serial  testing suggests a  clinically important change)    09:19   Troponin I  0.30 (0.00-0.05 0.05 ng/mL or less: NEGATIVE  Repeat testing in 3-6 hrs  if clinically indicated. >0.05 ng/mL: POTENTIAL  MYOCARDIAL INJURY. Repeat  testing in 3-6 hrs  if  clinically indicated. NOTE: An increase or decrease  of 30% or more on serial  testing suggests a  clinically important change)    12:35   Troponin I  0.30 (0.00-0.05 0.05 ng/mL or less: NEGATIVE  Repeat testing in 3-6 hrs  if clinically indicated. >0.05 ng/mL: POTENTIAL  MYOCARDIAL INJURY. Repeat  testing in 3-6 hrs if  clinically indicated. NOTE: An increase or decrease  of 30% or more on serial  testing suggests a  clinically important change)  Routine Hem:  16-Aug-15 03:04   WBC (CBC)  18.1  RBC (CBC)  3.36  Hemoglobin (CBC)  10.7  Hematocrit (CBC)  32.1  Platelet Count (CBC)  99  MCV 95  MCH 31.7  MCHC 33.3  RDW 13.2  Neutrophil % 94.8  Lymphocyte % 1.2  Monocyte % 3.7  Eosinophil % 0.1  Basophil % 0.2  Neutrophil #  17.1  Lymphocyte #  0.2  Monocyte # 0.7  Eosinophil # 0.0  Basophil # 0.0 (Result(s) reported on 21 Nov 2013 at  03:56AM.)   EKG:  Interpretation EKG shows NSR with St adn T wave ABN in teh anterlateral leads, APCs noted (mislabelled as atrial fib)   Rate 81   Radiology Results: XRay:    15-Aug-15 19:41, Chest Portable Single View  Chest Portable Single View   REASON FOR EXAM:    chills/weak  COMMENTS:       PROCEDURE: DXR - DXR PORTABLE CHEST SINGLE VIEW  - Nov 20 2013  7:41PM     CLINICAL DATA:  Chills and weakness.    EXAM:  PORTABLE CHEST - 1 VIEW    COMPARISON:  Single view of the chest 10/29/2010. CTchest  04/29/2013.    FINDINGS:  The lungs are clear. Heart size is normal. No pneumothorax or  pleural effusion.     IMPRESSION:  No acute disease.      Electronically Signed    By: Inge Rise M.D.    On: 11/20/2013 19:43         Verified By: Ramond Dial, M.D.,  [place]:    16-Aug-15 14:23, US Kidney Bilateral  US Kidney Bilateral   REASON FOR EXAM:    arf  COMMENTS:       PROCEDURE: [place]  - US KIDNEY  - Nov 21 2013  2:23PM     CLINICAL DATA:  Acute renal failure    EXAM:  RENAL/URINARY TRACT ULTRASOUND COMPLETE    COMPARISON:  04/20/2008    FINDINGS:  Right Kidney:  Surgically absent    Left Kidney:    Length: 11.4 cm. Suboptimally visualized secondary to body habitus.  Cortical thinning. Upper normal cortical echogenicity. Hypoechoic  structure at inferior pole potentially represents a cyst 2.4 x 2.8 x  2.8 cm, containing scattered low level internal echoes; this  measured 3.4 x 2.5 x 3.0 cm on prior exam. No additional mass or  hydronephrosis. Question minimal perinephric fluid.    Bladder:    Decompressed, wall appears thickened of this could be an artifact  from underdistention.   IMPRESSION:  Surgical absence of RIGHT kidney.    Cyst at inferior pole LEFT kidney smaller than on previous exam from  2010.    Question bladder wall thickening versus artifact from  underdistention.      Electronically Signed    By: Lavonia Dana M.D.     On: 11/21/2013 14:42       Verified By: Burnetta Sabin, M.D.,  Cardiology:    16-Aug-15 11:16, Echo Doppler  Echo Doppler   REASON FOR EXAM:      COMMENTS:       PROCEDURE: [facility] - ECHO DOPPLER COMPLETE(TRANSTHOR)  - Nov 21 2013 11:16AM     RESULT: Echocardiogram Report    Patient Name:   CEBASTIAN NEIS Date of Exam: 11/21/2013  Medical Rec #:  [id number]          Custom1:  Date of Birth:  30-Aug-1924       Height:       68.9 in  Patient Age:    74 years        Weight:       152.1 lb  Patient Gender: M               BSA:          1.84 m??    Indications: Atrial Fib  Sonographer:    Janalee Dane RCS  Referring Phys: Demetrios Loll    Summary:   1. Rhythm is atrial fibrillation.   2. Left ventricular ejection fraction, by visual estimation, is 55 to   60%.   3. Normal global left ventricular systolic function.   4. Moderate left ventricular hypertrophy.   5. Decreased left ventricular internal cavity size.   6. Normal right ventricular size and systolic function.   7. Moderately dilated left atrium.   8. Mildly dilated right atrium.   9. Mild mitral valve regurgitation.  10. Mild tricuspid regurgitation.  11. Mildly elevated pulmonary artery systolic pressure.  2D AND M-MODE MEASUREMENTS (normal ranges within parentheses):  Left Ventricle:          Normal  IVSd (2D):      1.43 cm (0.7-1.1)  LVPWd (2D):     1.56 cm (0.7-1.1) Aorta/LA:                  Normal  LVIDd (2D):     3.34 cm (3.4-5.7) Aortic Root (2D): 3.80 cm (2.4-3.7)  LVIDs (2D):     2.60 cm           Left Atrium (2D): 4.30 cm (1.9-4.0)  LV FS (2D):     22.3 %   (>25%)  LV EF (2D):     46.0 %   (>50%)                                    Right Ventricle:                                   RVd (2D):  LV DIASTOLIC FUNCTION:  MV Peak E: 0.63 m/s Decel Time: 377 msec  MV Peak A: 0.23 m/s  E/A Ratio: 2.74  SPECTRAL DOPPLER ANALYSIS (where applicable):  Mitral Valve:  MV P1/2 Time: 109.33 msec  MV Area, PHT:2.01  cm??  Tricuspid Valve and PA/RV Systolic Pressure: TR Max Velocity: 2.46 m/s RA   Pressure: 10 mmHg RVSP/PASP: 34.1 mmHg    PHYSICIAN INTERPRETATION:  Left Ventricle: The left ventricular internal cavity size was decreased.   Moderate left ventricular hypertrophy. Global LV systolic function was   normal. Left ventricular ejection fraction, by visual estimation, is 55   to 60%.  Right Ventricle: Normal right ventricular size, wall thickness, and   systolic function. The right ventricular sizeis normal. Global RV  systolic function is normal.  Left Atrium: The left atrium is moderately dilated.  Right Atrium: The right atrium is mildly dilated.  Pericardium: There is no evidence of pericardial effusion.  Mitral Valve: The mitral valve isnormal in structure. Mild mitral valve   regurgitation is seen.  Tricuspid Valve: The tricuspid valve is normal. Mild tricuspid   regurgitation is visualized. The tricuspid regurgitant velocity is 2.46   m/s, and with an assumed right atrial pressureof 10 mmHg, the estimated   right ventricular systolic pressure is mildly elevated at 34.1 mmHg.  Aortic Valve: The aortic valve is normal. Mild to moderate aortic valve   sclerosis/calcification is present, without any evidence of aortic   stenosis.No evidence of aortic valve regurgitation is seen.  Pulmonic Valve: The pulmonic valve is normal. Trace pulmonic valve   regurgitation.  Aorta: The aortic root and ascending aorta are structurally normal, with   no evidence of dilitation.  [id number] Ida Rogue MD  Electronically signed by [id number] Ida Rogue MD  Signature Date/Time: 11/21/2013/12:12:02 PM    *** Final ***    IMPRESSION: .        Verified By: Minna Merritts, M.D., MD    Contrast dye: Rash  Vital Signs/Nurse's Notes: **Vital Signs.:   16-Aug-15 11:56  Vital Signs Type Routine  Temperature Temperature (F) 98.2  Celsius 36.7  Temperature Source oral  Pulse Pulse 94  Respirations  Respirations 20  Systolic BP Systolic BP 91  Diastolic BP (mmHg) Diastolic BP (mmHg) 56  Mean BP 67  Pulse Ox % Pulse Ox % 94  Pulse Ox Activity Level  At rest  Oxygen Delivery Room Air/ 21 %    Impression 81 year old Caucasian male with a history of medication noncompliance, chronic dizziness, coronary artery disease, previous multiple stent placement with initial stent in 2006 in [place], 2008 at [facility] [place], catheter in August 2011 showing diffuse coronary artery disease, history of diabetes, 60-70% left internal carotid arterial disease, sleep apnea (noncompliant with CPAP),  hypertension, peripheral vascular disease, one kidney with chronic underlying kidney disease, history of polio with weakness on the left , presenting with  shaking and chills at about 4:00 p.m. yesterday,  presenting to the ED for further evaluation. Cardiology was consulted for possible atrial fibrillation  1. atrial fibrillation:  EKG shows NSR, no atrial fibrillation seen, even on tele EKG corrected HR controlled Would hold lovenox Restart oupt meds  2. UTI:  urine cx pending on rocephin and feeling better BP still low, likely from infection  pt self caths  3. hypotension:   from UTI cont gentle IVF echo with mildly elevated RVSP suggesting he is not dehydrated  4. hx CAD/stents  on plavix, statin  5. ARF on CRF stage 3  baseline 1.3  renal u/s completed Single kidney  6, elevated troponin  likley from demand ischemia from hypotension underlying CAD, leak Continue asa, hold lovenox   Electronic Signatures: Ida Rogue (MD)  (Signed 16-Aug-15 15:24)  Authored: General Aspect/Present Illness, History and Physical Exam, Review of System, Past Medical History, Health Issues, Home Medications, Labs, EKG , Radiology, Allergies, Vital Signs/Nurse's Notes, Impression/Plan   Last Updated: 16-Aug-15 15:24 by Ida Rogue (MD)

## 2014-07-30 NOTE — H&P (Signed)
PATIENT NAME:  [name], [name] MR#:  [id number] DATE OF BIRTH:  1924/05/13  DATE OF ADMISSION:  11/20/2013  PRIMARY CARE PHYSICIAN: Dr. Sampson GoonFitzgerald  REFERRING PHYSICIAN: Dr. Lenard LancePaduchowski.  ADDENDUM:    HOME MEDICATIONS: Levemir FlexTouch 100 units/ mL, 8 units subcutaneous if blood sugar is more than 150, glipizide 10 mg 0.5 tablet once a day, fluoxetine 10 mg 1 capsule once a day p.r.n.    ____________________________ Shaune PollackQing Latrisha Coiro, MD qc:JT D: 11/20/2013 21:53:19 ET T: 11/20/2013 23:44:07 ET JOB#: [id number]  cc: Shaune PollackQing Gianluca Chhim, MD, <Dictator> Shaune PollackQING Khalessi Blough MD ELECTRONICALLY SIGNED 11/21/2013 14:40

## 2015-01-24 ENCOUNTER — Other Ambulatory Visit: Payer: Self-pay | Admitting: Physician Assistant

## 2015-01-24 DIAGNOSIS — R197 Diarrhea, unspecified: Secondary | ICD-10-CM

## 2015-01-24 DIAGNOSIS — R1032 Left lower quadrant pain: Secondary | ICD-10-CM

## 2015-01-24 DIAGNOSIS — R1031 Right lower quadrant pain: Secondary | ICD-10-CM

## 2015-01-25 ENCOUNTER — Ambulatory Visit
Admission: RE | Admit: 2015-01-25 | Discharge: 2015-01-25 | Disposition: A | Discharge status: Home / Self Care | Payer: Commercial Managed Care - HMO | Source: Ambulatory Visit | Attending: Physician Assistant | Admitting: Physician Assistant

## 2015-01-25 ENCOUNTER — Other Ambulatory Visit
Admission: RE | Admit: 2015-01-25 | Discharge: 2015-01-25 | Disposition: A | Discharge status: Home / Self Care | Payer: Commercial Managed Care - HMO | Source: Ambulatory Visit | Attending: Physician Assistant | Admitting: Physician Assistant

## 2015-01-25 DIAGNOSIS — R197 Diarrhea, unspecified: Secondary | ICD-10-CM | POA: Diagnosis present

## 2015-01-25 DIAGNOSIS — R1031 Right lower quadrant pain: Secondary | ICD-10-CM

## 2015-01-25 DIAGNOSIS — Z905 Acquired absence of kidney: Secondary | ICD-10-CM | POA: Insufficient documentation

## 2015-01-25 DIAGNOSIS — Z9049 Acquired absence of other specified parts of digestive tract: Secondary | ICD-10-CM | POA: Diagnosis not present

## 2015-01-25 DIAGNOSIS — R1032 Left lower quadrant pain: Secondary | ICD-10-CM | POA: Diagnosis present

## 2015-01-25 LAB — C DIFFICILE QUICK SCREEN W PCR REFLEX
C DIFFICILE (CDIFF) INTERP: NEGATIVE
C DIFFICILE (CDIFF) TOXIN: NEGATIVE
C DIFFICLE (CDIFF) ANTIGEN: NEGATIVE

## 2015-01-27 LAB — STOOL CULTURE: CULTURE: NOT DETECTED

## 2015-01-30 LAB — O&P RESULT

## 2015-01-30 LAB — GIARDIA, EIA; OVA/PARASITE: Giardia Ag, Stl: NEGATIVE

## 2015-01-30 LAB — PANCREATIC ELASTASE, FECAL: PANCREATIC ELASTASE-1, STL: 104 ug Elast./g — AB (ref >200)
